# Patient Record
Sex: Female | Born: 1980
Health system: Southern US, Community
[De-identification: ages and names within clinical notes are randomized; demographics above are authoritative.]

## PROBLEM LIST (undated history)

## (undated) DIAGNOSIS — L309 Dermatitis, unspecified: Secondary | ICD-10-CM

## (undated) DIAGNOSIS — L858 Other specified epidermal thickening: Secondary | ICD-10-CM

## (undated) DIAGNOSIS — K219 Gastro-esophageal reflux disease without esophagitis: Secondary | ICD-10-CM

## (undated) DIAGNOSIS — R109 Unspecified abdominal pain: Secondary | ICD-10-CM

## (undated) DIAGNOSIS — L500 Allergic urticaria: Secondary | ICD-10-CM

## (undated) DIAGNOSIS — O24419 Gestational diabetes mellitus in pregnancy, unspecified control: Secondary | ICD-10-CM

## (undated) DIAGNOSIS — K1379 Other lesions of oral mucosa: Secondary | ICD-10-CM

## (undated) DIAGNOSIS — E559 Vitamin D deficiency, unspecified: Secondary | ICD-10-CM

## (undated) HISTORY — DX: Vitamin D deficiency, unspecified: E55.9

## (undated) HISTORY — DX: Dermatitis, unspecified: L30.9

## (undated) HISTORY — DX: Other specified epidermal thickening: L85.8

## (undated) HISTORY — DX: Gestational diabetes mellitus in pregnancy, unspecified control: O24.419

## (undated) HISTORY — DX: Unspecified abdominal pain: R10.9

## (undated) HISTORY — DX: Gastro-esophageal reflux disease without esophagitis: K21.9

## (undated) HISTORY — DX: Allergic urticaria: L50.0

## (undated) HISTORY — DX: Other lesions of oral mucosa: K13.79

---

## 2005-04-22 ENCOUNTER — Encounter: Admission: RE | Admit: 2005-04-22 | Discharge: 2005-04-22 | Payer: Self-pay | Admitting: *Deleted

## 2005-07-20 ENCOUNTER — Encounter: Admission: RE | Admit: 2005-07-20 | Discharge: 2005-07-20 | Payer: Self-pay | Admitting: *Deleted

## 2006-01-18 ENCOUNTER — Encounter: Admission: RE | Admit: 2006-01-18 | Discharge: 2006-01-18 | Payer: Self-pay | Admitting: *Deleted

## 2006-04-23 ENCOUNTER — Encounter: Admission: RE | Admit: 2006-04-23 | Discharge: 2006-04-23 | Payer: Self-pay | Admitting: *Deleted

## 2006-08-27 ENCOUNTER — Encounter: Admission: RE | Admit: 2006-08-27 | Discharge: 2006-08-27 | Payer: Self-pay | Admitting: *Deleted

## 2006-11-30 ENCOUNTER — Encounter: Admission: RE | Admit: 2006-11-30 | Discharge: 2007-01-25 | Payer: Self-pay | Admitting: *Deleted

## 2007-02-02 ENCOUNTER — Inpatient Hospital Stay (HOSPITAL_COMMUNITY): Admission: RE | Admit: 2007-02-02 | Discharge: 2007-02-05 | Payer: Self-pay | Admitting: *Deleted

## 2007-03-22 ENCOUNTER — Encounter: Admission: RE | Admit: 2007-03-22 | Discharge: 2007-03-22 | Payer: Self-pay | Admitting: *Deleted

## 2007-04-28 HISTORY — PX: TOOTH EXTRACTION: SHX859

## 2009-02-28 ENCOUNTER — Ambulatory Visit: Payer: Self-pay | Admitting: Diagnostic Radiology

## 2009-02-28 ENCOUNTER — Ambulatory Visit (HOSPITAL_BASED_OUTPATIENT_CLINIC_OR_DEPARTMENT_OTHER): Admission: RE | Admit: 2009-02-28 | Discharge: 2009-02-28 | Payer: Self-pay | Admitting: Family Medicine

## 2009-07-10 ENCOUNTER — Encounter: Admission: RE | Admit: 2009-07-10 | Discharge: 2009-07-10 | Payer: Self-pay | Admitting: Obstetrics

## 2010-05-28 LAB — HM PAP SMEAR

## 2010-09-09 NOTE — Op Note (Signed)
Yolanda Mccoy, Yolanda Mccoy        ACCOUNT NO.:  [id number]   MEDICAL RECORD NO.:  [id number]          PATIENT TYPE:  INP   LOCATION:  9126                          [facility]ACILITY:  [facility]   PHYSICIAN:  [place] B. Earlene Plater, M.D.  DATE OF BIRTH:  April 27, 1981   DATE OF PROCEDURE:  02/02/2007  DATE OF DISCHARGE:                               OPERATIVE REPORT   PREOPERATIVE DIAGNOSES:  1. Thirty-nine week intrauterine pregnancy.  2. Breech presentation.  3. Refuses attempted external cephalic version.   POSTOPERATIVE DIAGNOSES:  1. Thirty-nine week intrauterine pregnancy.  2. Breech presentation.  3. Refuses attempted external cephalic version.   PROCEDURE:  Primary low transverse C-section.   SURGEON:  Chester Holstein. Earlene Plater, MD.   ASSISTANT:  Lenoard Aden, MD.   ANESTHESIA:  Spinal.   SPECIMENS:  None.   FINDINGS:  A breech presentation, single-footling female, 9 and 9  Apgar's, 5 pounds 10 ounces.   ESTIMATED BLOOD LOSS:  500.   COMPLICATIONS:  None.   INDICATIONS:  Patient with the above-history for primary C-section.  The  risks of surgery were discussed including infection, bleeding, and  damage to surrounding organs.   PROCEDURE:  The patient was taken to the operating room and spinal  anesthesia obtained.  She was prepped and draped in the standard  fashion, a Foley catheter inserted into the bladder.  A Pfannenstiel  incision made, the fascia divided sharply, the posterior sheath to the  peritoneum were elevated and entered sharply, a bladder blade inserted,  a bladder flap created sharply, the uterine incision made in a low  transverse fashion with a knife.  Clear fluid at amniotomy, the incision  extended laterally bluntly and with bandage scissors.   The cavity was explored, a single-footling breech presentation  encountered.  This foot was delivered through the incision and traced  back up to the hips to find the left leg, which was then delivered by  flexion at the knee  and traction at the ankle, the fetus was then  delivered to the posterior iliac crest and rotated to sacrum anterior  with traction at the iliac crests to deliver to the scapulae.  Each arm  was delivered by flexion of the elbows, the head was delivered by  flexion of the neck, nose and mouth suctioned with the bulb, a single  nuchal cord encountered.  Cord clamped and cut, infant handed off to  awaiting pediatricians.  One gram of Ancef given at cord clamp.   The placenta was removed manually, the uterus left in the abdomen and  cleared of all clots and debris.  The uterine incision was free of  extension.  Closed in a running lock stitch with #0 chromic, a second  imbricating layer placed with the same suture, and hemostasis obtained.   The pelvis was irrigated, the uterine incision and bladder flap appeared  normal and were hemostatic.   The fascia was closed in a running stitch of #0 Vicryl.  The  subcutaneous tissue was irrigated and made hemostatic with the Bovie.  The skin was closed with staples.   The patient tolerated the procedure well with no  complications.  She was  taken to the recovery room awake, alert, and in stable condition.  All  counts were correct per the operating room staff.      Gerri Spore B. Earlene Plater, M.D.  Electronically Signed     WBD/MEDQ  D:  02/02/2007  T:  02/02/2007  Job:  [id number]

## 2010-09-12 NOTE — Discharge Summary (Signed)
Yolanda Mccoy, Yolanda Mccoy        ACCOUNT NO.:  [id number]   MEDICAL RECORD NO.:  [id number]          PATIENT TYPE:  INP   LOCATION:  9126                          [facility]   PHYSICIAN:  [place] B. Earlene Plater, M.D.  DATE OF BIRTH:  01-23-1981   DATE OF ADMISSION:  02/02/2007  DATE OF DISCHARGE:  02/05/2007                               DISCHARGE SUMMARY   ADMISSION DIAGNOSES:  1. A 39-week intrauterine pregnancy.  2. Breech presentation.   DISCHARGE DIAGNOSES:  1. A 39-week intrauterine pregnancy.  2. Breech presentation.   PROCEDURE:  Primary low-transverse C-section.   HISTORY OF PRESENT ILLNESS:  A 30 year old Asian female, gravida 1, para  zero with persistent breech presentation, declined attempt at external  cephalic version presented for operative delivery.   HOSPITAL COURSE:  The patient was admitted, underwent a primary low-  transverse C-section.  Single footling breech female.  Apgar's 9 and 9.  Weight 5 pounds 10 ounces noted at the time of surgery.  Normal  appearing uterus, tubes, ovaries.  Postoperatively, the patient  rapidly regained her ability to ambulate,  void, and tolerate a regular diet.  She was discharged home on the 3rd  postoperative day in satisfactory condition.   DISCHARGE MEDICATIONS:  1. Tylox 1-2 p.o. q.4-6 h. p.r.n. pain.  2. Ferrous sulfate 325 mg p.o. daily.   DISCHARGE INSTRUCTIONS:  Per booklet.   FOLLOWUP:  Six weeks.      Gerri Spore B. Earlene Plater, M.D.  Electronically Signed     WBD/MEDQ  D:  03/03/2007  T:  03/03/2007  Job:  [id number]

## 2011-02-05 LAB — CBC
HCT: 27.8 — ABNORMAL LOW
HCT: 39.3
Hemoglobin: 13.2
MCHC: 33.6
MCHC: 33.8
MCV: 83.4
Platelets: 208
Platelets: 268
RBC: 4.71
RDW: 13.9
RDW: 14.2 — ABNORMAL HIGH
WBC: 8.5

## 2011-02-05 LAB — DIFFERENTIAL
Basophils Absolute: 0
Basophils Relative: 0
Eosinophils Absolute: 0.2
Eosinophils Relative: 3
Lymphocytes Relative: 30
Lymphs Abs: 2.5
Monocytes Absolute: 0.8 — ABNORMAL HIGH
Monocytes Relative: 10
Neutro Abs: 4.9
Neutrophils Relative %: 58

## 2011-02-05 LAB — RPR: RPR Ser Ql: NONREACTIVE

## 2011-04-02 ENCOUNTER — Encounter: Payer: Self-pay | Admitting: Family Medicine

## 2011-04-02 ENCOUNTER — Ambulatory Visit (INDEPENDENT_AMBULATORY_CARE_PROVIDER_SITE_OTHER): Payer: 59 | Admitting: Family Medicine

## 2011-04-02 DIAGNOSIS — L858 Other specified epidermal thickening: Secondary | ICD-10-CM

## 2011-04-02 DIAGNOSIS — K1379 Other lesions of oral mucosa: Secondary | ICD-10-CM

## 2011-04-02 DIAGNOSIS — L309 Dermatitis, unspecified: Secondary | ICD-10-CM

## 2011-04-02 DIAGNOSIS — K219 Gastro-esophageal reflux disease without esophagitis: Secondary | ICD-10-CM

## 2011-04-02 DIAGNOSIS — K121 Other forms of stomatitis: Secondary | ICD-10-CM

## 2011-04-02 DIAGNOSIS — R109 Unspecified abdominal pain: Secondary | ICD-10-CM

## 2011-04-02 DIAGNOSIS — Z23 Encounter for immunization: Secondary | ICD-10-CM

## 2011-04-02 DIAGNOSIS — O24419 Gestational diabetes mellitus in pregnancy, unspecified control: Secondary | ICD-10-CM | POA: Insufficient documentation

## 2011-04-02 DIAGNOSIS — Z0000 Encounter for general adult medical examination without abnormal findings: Secondary | ICD-10-CM | POA: Insufficient documentation

## 2011-04-02 HISTORY — DX: Dermatitis, unspecified: L30.9

## 2011-04-02 HISTORY — DX: Gastro-esophageal reflux disease without esophagitis: K21.9

## 2011-04-02 HISTORY — DX: Other lesions of oral mucosa: K13.79

## 2011-04-02 HISTORY — DX: Other specified epidermal thickening: L85.8

## 2011-04-02 HISTORY — DX: Unspecified abdominal pain: R10.9

## 2011-04-02 NOTE — Assessment & Plan Note (Signed)
Encouraged Cetaphil soap and lotion during flares

## 2011-04-02 NOTE — Assessment & Plan Note (Signed)
Will check fasting labs next week

## 2011-04-02 NOTE — Assessment & Plan Note (Signed)
Check HSV titers and consider possibility of reflux leading to ulcers

## 2011-04-02 NOTE — Assessment & Plan Note (Signed)
She reports pain that has been steady since her C Section 4 years ago. It is worse at times and is especially painful when she tries to lie on the left side at night.

## 2011-04-02 NOTE — Assessment & Plan Note (Signed)
Struggles with dry scaly, itchy patches on her leg mostly during the winter. She has a steroid cream she uses as needed. She is encouraged to maintain good hydration and add MegaRed cap daily

## 2011-04-02 NOTE — Progress Notes (Signed)
Yolanda Mccoy [id number] 08/25/1980 04/02/2011      Progress Note New Patient  Subjective  Chief Complaint  Chief Complaint  Patient presents with  . Establish Care    new patient    HPI  Patient is a 30 year old female from [place] in for new patient appointment. She is accompanied by her husband. Her English is adequate but he does help with interpretation at times. She came to the [place] 7 years ago and reports an episode of reflux and abdominal pain which resulted in treatment of bacteria in her abdomen that required several antibiotics for several weeks her reflux symptoms resolved at that time only to recur with pregnancy 4 years ago. With pregnancy she did so with reflux and gestational diabetes. Since the pregnancy she's not had significant reflux that comes on only occasionally but she has had persistent left-sided abdominal pain. It is present most of the time but significantly worse when she tries to live on the left side. She has kept up with her GYN care until this year. At this time she is about 18 months past her Pap smear but denies any trouble with her cycles vaginal discharge. She does move her bowels most days. She did recently have a bout of constipation for 3 days but that has resolved. No fevers, chills, urinary complaints. She does note some problems with dry, scaly patches on her legs responsive to steroid cream and worse in the winter. He also notes some episodes of small asymptomatic bumps on her upper arms. Not present now. No recent febrile illness, congestion, chest pain, palpitations, shortness of breath  Past Medical History  Diagnosis Date  . Chicken pox as a child  . Gestational diabetes   . Eczema 04/02/2011  . Keratosis pilaris 04/02/2011  . Reflux 04/02/2011  . Recurrent oral ulcers 04/02/2011  . Preventative health care 04/02/2011  . Abdominal pain, left lateral 04/02/2011    Past Surgical History  Procedure Date  . Cesarean section 2008    . Tooth extraction 2009    History reviewed. No pertinent family history.  History   Social History  . Marital Status: Married    Spouse Name: N/A    Number of Children: N/A  . Years of Education: N/A   Occupational History  . Not on file.   Social History Main Topics  . Smoking status: Never Smoker   . Smokeless tobacco: Never Used  . Alcohol Use: No  . Drug Use: No  . Sexually Active: Yes   Other Topics Concern  . Not on file   Social History Narrative  . No narrative on file    No current outpatient prescriptions on file prior to visit.    Allergies  Allergen Reactions  . Codeine Nausea Only    Review of Systems  Review of Systems  Constitutional: Negative for fever, chills and malaise/fatigue.  HENT: Negative for hearing loss, nosebleeds and congestion.        Oral ulcers, recurrent, worse during bouts of constipation  Eyes: Negative for discharge.  Respiratory: Negative for cough, sputum production, shortness of breath and wheezing.   Cardiovascular: Negative for chest pain, palpitations and leg swelling.  Gastrointestinal: Negative for heartburn, nausea, vomiting, abdominal pain, diarrhea, constipation and blood in stool.  Genitourinary: Negative for dysuria, urgency, frequency and hematuria.  Musculoskeletal: Negative for myalgias, back pain and falls.  Skin: Negative for rash.  Neurological: Negative for dizziness, tremors, sensory change, focal weakness, loss of consciousness, weakness and  headaches.  Endo/Heme/Allergies: Negative for polydipsia. Does not bruise/bleed easily.  Psychiatric/Behavioral: Negative for depression and suicidal ideas. The patient is not nervous/anxious and does not have insomnia.     Objective  BP 118/76  Pulse 72  Temp(Src) 97.7 F (36.5 C) (Oral)  Ht 5\' 1"  (1.549 m)  Wt 109 lb 12.8 oz (49.805 kg)  BMI 20.75 kg/m2  SpO2 98%  LMP 03/19/2011  Physical Exam  Physical Exam  Constitutional: She is oriented to  person, place, and time and well-developed, well-nourished, and in no distress. No distress.  HENT:  Head: Normocephalic and atraumatic.  Right Ear: External ear normal.  Left Ear: External ear normal.  Nose: Nose normal.  Mouth/Throat: Oropharynx is clear and moist. No oropharyngeal exudate.  Eyes: Conjunctivae are normal. Pupils are equal, round, and reactive to light. Right eye exhibits no discharge. Left eye exhibits no discharge. No scleral icterus.  Neck: Normal range of motion. Neck supple. No thyromegaly present.  Cardiovascular: Normal rate, regular rhythm, normal heart sounds and intact distal pulses.   No murmur heard. Pulmonary/Chest: Effort normal and breath sounds normal. No respiratory distress. She has no wheezes. She has no rales.  Abdominal: Soft. Bowel sounds are normal. She exhibits no distension and no mass. There is no tenderness.  Musculoskeletal: Normal range of motion. She exhibits no edema and no tenderness.  Lymphadenopathy:    She has no cervical adenopathy.  Neurological: She is alert and oriented to person, place, and time. She has normal reflexes. No cranial nerve deficit. Coordination normal.  Skin: Skin is warm and dry. No rash noted. She is not diaphoretic.  Psychiatric: Mood, memory and affect normal.       Assessment & Plan  Reflux Had heartburn during gestation and now has it less frequently. She moved here from [place] and she describes needing a treatment for a stomach bacteria which required several antibiotics and sounds suspicious for H Pylori. Presently she is having increasing abdominal pain. She will be referred for further evaluation to gastroenterology. She has suffered a recent bout of constipation and a flare in reflux   Keratosis pilaris Encouraged Cetaphil soap and lotion during flares  Eczema Struggles with dry scaly, itchy patches on her leg mostly during the winter. She has a steroid cream she uses as needed. She is encouraged to  maintain good hydration and add MegaRed cap daily  Recurrent oral ulcers Check HSV titers and consider possibility of reflux leading to ulcers  Gestational diabetes Will check fasting labs next week  Abdominal pain, left lateral She reports pain that has been steady since her C Section 4 years ago. It is worse at times and is especially painful when she tries to lie on the left side at night.

## 2011-04-02 NOTE — Patient Instructions (Signed)
Gastroesophageal Reflux Disease, Adult Gastroesophageal reflux disease (GERD) happens when acid from your stomach flows up into the esophagus. When acid comes in contact with the esophagus, the acid causes soreness (inflammation) in the esophagus. Over time, GERD may create small holes (ulcers) in the lining of the esophagus. CAUSES   Increased body weight. This puts pressure on the stomach, making acid rise from the stomach into the esophagus.   Smoking. This increases acid production in the stomach.   Drinking alcohol. This causes decreased pressure in the lower esophageal sphincter (valve or ring of muscle between the esophagus and stomach), allowing acid from the stomach into the esophagus.   Late evening meals and a full stomach. This increases pressure and acid production in the stomach.   A malformed lower esophageal sphincter.  Sometimes, no cause is found. SYMPTOMS   Burning pain in the lower part of the mid-chest behind the breastbone and in the mid-stomach area. This may occur twice a week or more often.   Trouble swallowing.   Sore throat.    For the skin need Cetaphil soap and lotion For dry skin increase fluids to at least 64 oz daily and start a MegaRed cap daily For the abdominal pain start a probiotic such as Align caps daily and a fiber supplement such as Benfiber daily  Dry cough.   Asthma-like symptoms including chest tightness, shortness of breath, or wheezing.  DIAGNOSIS  Your caregiver may be able to diagnose GERD based on your symptoms. In some cases, X-rays and other tests may be done to check for complications or to check the condition of your stomach and esophagus. TREATMENT  Your caregiver may recommend over-the-counter or prescription medicines to help decrease acid production. Ask your caregiver before starting or adding any new medicines.  HOME CARE INSTRUCTIONS   Change the factors that you can control. Ask your caregiver for guidance concerning  weight loss, quitting smoking, and alcohol consumption.   Avoid foods and drinks that make your symptoms worse, such as:   Caffeine or alcoholic drinks.   Chocolate.   Peppermint or mint flavorings.   Garlic and onions.   Spicy foods.   Citrus fruits, such as oranges, lemons, or limes.   Tomato-based foods such as sauce, chili, salsa, and pizza.   Fried and fatty foods.   Avoid lying down for the 3 hours prior to your bedtime or prior to taking a nap.   Eat small, frequent meals instead of large meals.   Wear loose-fitting clothing. Do not wear anything tight around your waist that causes pressure on your stomach.   Raise the head of your bed 6 to 8 inches with wood blocks to help you sleep. Extra pillows will not help.   Only take over-the-counter or prescription medicines for pain, discomfort, or fever as directed by your caregiver.   Do not take aspirin, ibuprofen, or other nonsteroidal anti-inflammatory drugs (NSAIDs).  SEEK IMMEDIATE MEDICAL CARE IF:   You have pain in your arms, neck, jaw, teeth, or back.   Your pain increases or changes in intensity or duration.   You develop nausea, vomiting, or sweating (diaphoresis).   You develop shortness of breath, or you faint.   Your vomit is green, yellow, black, or looks like coffee grounds or blood.   Your stool is red, bloody, or black.  These symptoms could be signs of other problems, such as heart disease, gastric bleeding, or esophageal bleeding. MAKE SURE YOU:   Understand these instructions.  Will watch your condition.   Will get help right away if you are not doing well or get worse.  Document Released: 01/21/2005 Document Revised: 12/24/2010 Document Reviewed: 10/31/2010 Exit[employer] Patient Information 2012 [employer]. 

## 2011-04-02 NOTE — Assessment & Plan Note (Addendum)
Had heartburn during gestation and now has it less frequently. She moved here from [place] and she describes needing a treatment for a stomach bacteria which required several antibiotics and sounds suspicious for H Pylori. Presently she is having increasing abdominal pain. She will be referred for further evaluation to gastroenterology. She has suffered a recent bout of constipation and a flare in reflux

## 2011-04-06 ENCOUNTER — Other Ambulatory Visit: Payer: Self-pay | Admitting: Family Medicine

## 2011-04-06 ENCOUNTER — Other Ambulatory Visit (INDEPENDENT_AMBULATORY_CARE_PROVIDER_SITE_OTHER): Payer: 59

## 2011-04-06 DIAGNOSIS — Z0000 Encounter for general adult medical examination without abnormal findings: Secondary | ICD-10-CM

## 2011-04-06 DIAGNOSIS — K121 Other forms of stomatitis: Secondary | ICD-10-CM

## 2011-04-06 DIAGNOSIS — K1370 Unspecified lesions of oral mucosa: Secondary | ICD-10-CM

## 2011-04-06 DIAGNOSIS — R109 Unspecified abdominal pain: Secondary | ICD-10-CM

## 2011-04-06 LAB — CBC
HCT: 37.5 % (ref 36.0–46.0)
MCHC: 33.5 g/dL (ref 30.0–36.0)
RDW: 13.4 % (ref 11.5–14.6)
WBC: 6 10*3/uL (ref 4.5–10.5)

## 2011-04-06 LAB — RENAL FUNCTION PANEL
BUN: 14 mg/dL (ref 6–23)
Chloride: 103 mEq/L (ref 96–112)
GFR: 89.19 mL/min (ref >60.00)
Glucose, Bld: 88 mg/dL (ref 70–99)
Phosphorus: 3.9 mg/dL (ref 2.3–4.6)

## 2011-04-06 LAB — LIPID PANEL
Cholesterol: 183 mg/dL (ref 0–200)
HDL: 70.9 mg/dL (ref >39.00)
Total CHOL/HDL Ratio: 3
Triglycerides: 72 mg/dL (ref 0.0–149.0)

## 2011-04-06 LAB — HEPATIC FUNCTION PANEL
Albumin: 4.3 g/dL (ref 3.5–5.2)
Alkaline Phosphatase: 39 U/L (ref 39–117)
Total Protein: 7.6 g/dL (ref 6.0–8.3)

## 2011-04-06 LAB — TSH: TSH: 2.44 u[IU]/mL (ref 0.35–5.50)

## 2011-04-07 LAB — SEDIMENTATION RATE: Sed Rate: 31 mm/hr — ABNORMAL HIGH (ref 0–22)

## 2011-04-17 ENCOUNTER — Ambulatory Visit (INDEPENDENT_AMBULATORY_CARE_PROVIDER_SITE_OTHER): Payer: 59 | Admitting: Family Medicine

## 2011-04-17 ENCOUNTER — Encounter: Payer: Self-pay | Admitting: Family Medicine

## 2011-04-17 VITALS — BP 113/75 | HR 57 | Ht 61.0 in | Wt 111.0 lb

## 2011-04-17 DIAGNOSIS — B002 Herpesviral gingivostomatitis and pharyngotonsillitis: Secondary | ICD-10-CM

## 2011-04-17 MED ORDER — VALACYCLOVIR HCL 1 G PO TABS: ORAL_TABLET | ORAL | Status: DC

## 2011-04-17 NOTE — Progress Notes (Signed)
OFFICE NOTE  04/17/2011  CC:  Chief Complaint  Patient presents with  . discuss recent labs     HPI: Patient is a 30 y.o. [place] female who is here for discussion of recent +HSV 1 and HVS 2 antibody levels.  She is fairly fluent in [place], husband is accompanying her today and occasionally helps translate.   She has hx of recurrent oral ulcers. Says they occur about 1 time a month and last about 7d, usually located on right buccal mucosa area, painful.  Occasionally has one on tongue. This problem has been occurring for at least several years.  HSV 1 and 2 antibody testing here recently were both positive. She denies any history of genital ulcers.  Husband is here with her today and denies hx of either oral or genital ulcers.  They have a toddler daughter who hasn't had any ulcers.  Pt reports that full prenatal lab panel when pregnant 4 yrs ago was normal, no mention of any abnormal HSV testing. She has otherwise felt well, no recurrent infectious illnesses, no anorexia or wt loss.  Pertinent PMH:  GERD Gest DM Recurrent oral ulcers  Pertinent Meds: None  PE: Blood pressure 113/75, pulse 57, height 5\' 1"  (1.549 m), weight 111 lb (50.349 kg), last menstrual period 04/14/2011. Gen: Alert, well appearing.  Patient is oriented to person, place, time, and situation. ENT: lips/face without lesion.  Oral cavity shows one oval, grey-based, superficial ulceration in right buccal mucosa about 2mm in size.  No other lesions. No pharyngeal erythema or swelling or exudate.   Neck: no LAD      Chemistry      Component Value Date/Time   NA 139 04/06/2011 0938   K 4.0 04/06/2011 0938   CL 103 04/06/2011 0938   CO2 25 04/06/2011 0938   BUN 14 04/06/2011 0938   CREATININE 0.8 04/06/2011 0938      Component Value Date/Time   CALCIUM 9.5 04/06/2011 0938   ALKPHOS 39 04/06/2011 0938   AST 17 04/06/2011 0938   ALT 12 04/06/2011 0938   BILITOT 1.1 04/06/2011 0938     Lab Results    Component Value Date   WBC 6.0 04/06/2011   HGB 12.6 04/06/2011   HCT 37.5 04/06/2011   MCV 80.4 04/06/2011   PLT 335.0 04/06/2011    IMPRESSION AND PLAN: Herpes stomatitis, recurrent. HSV 1 and HSV 2 IgG +.  Discussed general nature of HSV virus, shedding, chronicity at times, usual locations of various ulcer types, etc. I recommended valtrex suppression therapy, 1 g qd and she was agreeable to this. I answered pt's and husbands questions today.  Reassured pt that usually recurrent HSV is not an indication of any SERIOUS underlying immunodeficiency, but we could recheck her HIV status at any time.  I told her I didn't thinks she necessarily needed retesting of HIV today and she and her husband agreed.  FOLLOW UP: 88mo

## 2011-04-30 ENCOUNTER — Encounter: Payer: Self-pay | Admitting: Gastroenterology

## 2011-05-05 ENCOUNTER — Encounter: Payer: Self-pay | Admitting: Gastroenterology

## 2011-05-05 ENCOUNTER — Ambulatory Visit (INDEPENDENT_AMBULATORY_CARE_PROVIDER_SITE_OTHER): Payer: 59 | Admitting: Gastroenterology

## 2011-05-05 DIAGNOSIS — R109 Unspecified abdominal pain: Secondary | ICD-10-CM

## 2011-05-05 NOTE — Patient Instructions (Addendum)
You will be set up for a CT scan of abdomen and pelvis with IV and oral contrast.   You have been scheduled for a CT scan of the abdomen and pelvis at [facility] ([address]---this is in the same building as Architectural technologist).   You are scheduled on 05/06/11 at 230 pm. You should arrive 15 minutes prior to your appointment time for registration. Please follow the written instructions below on the day of your exam:  WARNING: IF YOU ARE ALLERGIC TO IODINE/X-RAY DYE, PLEASE NOTIFY RADIOLOGY IMMEDIATELY AT [phone]! YOU WILL BE GIVEN A 13 HOUR PREMEDICATION PREP.  1) Do not eat or drink anything after 1030 am (4 hours prior to your test) 2) You have been given 2 bottles of oral contrast to drink. The solution may taste better if refrigerated, but do NOT add ice or any other liquid to this solution. Shake  well before drinking.    Drink 1 bottle of contrast @ 1230 pm (2 hours prior to your exam)  Drink 1 bottle of contrast @ 130 pm  (1 hour prior to your exam)   The purpose of you drinking the oral contrast is to aid in the visualization of your intestinal tract. The contrast solution may cause some diarrhea. Before your exam is started, you will be given a small amount of fluid to drink. Depending on your individual set of symptoms, you may also receive an intravenous injection of x-ray contrast/dye. Plan on being at [facility] for 30 minutes or longer, depending on the type of exam you are having performed.  If you have any questions regarding your exam or if you need to reschedule, you may call the CT department at [phone] between the hours of 8:00 am and 5:00 pm, Monday-Friday.  ________________________________________________________________________

## 2011-05-05 NOTE — Progress Notes (Signed)
HPI: This is a   very pleasant 31 year old [place] woman who speaks fairly well in [place], she is here with her husband today. This is the first time I am meeting her.  Has had lefts sided abd pains for about 3-4 years.  Started after the c section.    Pain is ONLY present when laying on the left side.  No post prandial component.  The pain feels like something is pushing from the side.She turns to lay on her other side and hte pain is gone.    She was told her "stomach was out of position".  Abdominal ultrasound November 2010 was normal.  Complete metabolic profile, CBC December 2012 were normal.  Review of systems: Pertinent positive and negative review of systems were noted in the above HPI section. Complete review of systems was performed and was otherwise normal.    Past Medical History  Diagnosis Date  . Chicken pox as a child  . Gestational diabetes   . Eczema 04/02/2011  . Keratosis pilaris 04/02/2011  . Reflux 04/02/2011  . Recurrent oral ulcers 04/02/2011  . Preventative health care 04/02/2011  . Abdominal pain, left lateral 04/02/2011    Past Surgical History  Procedure Date  . Cesarean section 2008  . Tooth extraction 2009    Current Outpatient Prescriptions  Medication Sig Dispense Refill  . valACYclovir (VALTREX) 1000 MG tablet 1 tab po qd  30 tablet  6    Allergies as of 05/05/2011 - Review Complete 05/05/2011  Allergen Reaction Noted  . Codeine Nausea Only 04/02/2011    History reviewed. No pertinent family history.  History   Social History  . Marital Status: Married    Spouse Name: N/A    Number of Children: 1  . Years of Education: N/A   Occupational History  . housewife    Social History Main Topics  . Smoking status: Never Smoker   . Smokeless tobacco: Never Used  . Alcohol Use: No  . Drug Use: No  . Sexually Active: Yes   Other Topics Concern  . Not on file   Social History Narrative  . No narrative on file       Physical  Exam: BP 100/68  Pulse 70  Ht 5\' 1"  (1.549 m)  Wt 112 lb 4.8 oz (50.939 kg)  BMI 21.22 kg/m2  SpO2 99%  LMP 04/14/2011 Constitutional: generally well-appearing Psychiatric: alert and oriented x3 Eyes: extraocular movements intact Mouth: oral pharynx moist, no lesions Neck: supple no lymphadenopathy Cardiovascular: heart regular rate and rhythm Lungs: clear to auscultation bilaterally Abdomen: soft, nontender, nondistended, no obvious ascites, no peritoneal signs, normal bowel sounds Extremities: no lower extremity edema bilaterally Skin: no lesions on visible extremities    Assessment and plan: 31 y.o. female with  mild left sided abdominal pain that is very positional  Eating does not alter the pain, moving her bowels does not alter the pain. Only laying on her left side causes the pain. This seems more musculoskeletal than gastrointestinal. She had abdominal ultrasound about 2-1/2 years ago and that was normal. I think repeat imaging with a CT scan abdomen and pelvis is the next best step if that is unrevealing, given no other GI symptoms I would probably refer her back to her primary care physician.

## 2011-05-06 ENCOUNTER — Ambulatory Visit (INDEPENDENT_AMBULATORY_CARE_PROVIDER_SITE_OTHER)
Admission: RE | Admit: 2011-05-06 | Discharge: 2011-05-06 | Disposition: A | Discharge status: Home / Self Care | Payer: 59 | Source: Ambulatory Visit | Attending: Gastroenterology | Admitting: Gastroenterology

## 2011-05-06 DIAGNOSIS — R109 Unspecified abdominal pain: Secondary | ICD-10-CM

## 2011-05-06 MED ORDER — IOHEXOL 300 MG/ML  SOLN
80.0000 mL | Freq: Once | INTRAMUSCULAR | Status: AC | PRN
  Administered 2011-05-06: 80 mL via INTRAVENOUS

## 2011-06-11 ENCOUNTER — Encounter: Payer: Self-pay | Admitting: Family Medicine

## 2011-06-11 ENCOUNTER — Ambulatory Visit (INDEPENDENT_AMBULATORY_CARE_PROVIDER_SITE_OTHER): Payer: 59 | Admitting: Family Medicine

## 2011-06-11 DIAGNOSIS — R319 Hematuria, unspecified: Secondary | ICD-10-CM

## 2011-06-11 DIAGNOSIS — R109 Unspecified abdominal pain: Secondary | ICD-10-CM

## 2011-06-11 LAB — POCT URINALYSIS DIPSTICK
Bilirubin, UA: NEGATIVE
Glucose, UA: NEGATIVE
Spec Grav, UA: 1.02
pH, UA: 6.5

## 2011-06-11 LAB — LIPASE: Lipase: 33 U/L (ref 11.0–59.0)

## 2011-06-11 NOTE — Progress Notes (Signed)
Addended by: Baldemar Lenis R on: 06/11/2011 11:34 AM   Modules accepted: Orders

## 2011-06-11 NOTE — Assessment & Plan Note (Signed)
No c/o at this time.

## 2011-06-11 NOTE — Patient Instructions (Signed)
Ovarian Cyst The ovaries are small organs that are on each side of the uterus. The ovaries are the organs that produce the female hormones, estrogen and progesterone. An ovarian cyst is a sac filled with fluid that can vary in its size. It is normal for a small cyst to form in women who are in the childbearing age and who have menstrual periods. This type of cyst is called a follicle cyst that becomes an ovulation cyst (corpus luteum cyst) after it produces the women's egg. It later goes away on its own if the woman does not become pregnant. There are other kinds of ovarian cysts that may cause problems and may need to be treated. The most serious problem is a cyst with cancer. It should be noted that menopausal women who have an ovarian cyst are at a higher risk of it being a cancer cyst. They should be evaluated very quickly, thoroughly and followed closely. This is especially true in menopausal women because of the high rate of ovarian cancer in women in menopause. CAUSES AND TYPES OF OVARIAN CYSTS:  FUNCTIONAL CYST: The follicle/corpus luteum cyst is a functional cyst that occurs every month during ovulation with the menstrual cycle. They go away with the next menstrual cycle if the woman does not get pregnant. Usually, there are no symptoms with a functional cyst.   ENDOMETRIOMA CYST: This cyst develops from the lining of the uterus tissue. This cyst gets in or on the ovary. It grows every month from the bleeding during the menstrual period. It is also called a "chocolate cyst" because it becomes filled with blood that turns brown. This cyst can cause pain in the lower abdomen during intercourse and with your menstrual period.   CYSTADENOMA CYST: This cyst develops from the cells on the outside of the ovary. They usually are not cancerous. They can get very big and cause lower abdomen pain and pain with intercourse. This type of cyst can twist on itself, cut off its blood supply and cause severe pain.  It also can easily rupture and cause a lot of pain.   DERMOID CYST: This type of cyst is sometimes found in both ovaries. They are found to have different kinds of body tissue in the cyst. The tissue includes skin, teeth, hair, and/or cartilage. They usually do not have symptoms unless they get very big. Dermoid cysts are rarely cancerous.   POLYCYSTIC OVARY: This is a rare condition with hormone problems that produces many small cysts on both ovaries. The cysts are follicle-like cysts that never produce an egg and become a corpus luteum. It can cause an increase in body weight, infertility, acne, increase in body and facial hair and lack of menstrual periods or rare menstrual periods. Many women with this problem develop type 2 diabetes. The exact cause of this problem is unknown. A polycystic ovary is rarely cancerous.   THECA LUTEIN CYST: Occurs when too much hormone (human chorionic gonadotropin) is produced and over-stimulates the ovaries to produce an egg. They are frequently seen when doctors stimulate the ovaries for invitro-fertilization (test tube babies).   LUTEOMA CYST: This cyst is seen during pregnancy. Rarely it can cause an obstruction to the birth canal during labor and delivery. They usually go away after delivery.  SYMPTOMS   Pelvic pain or pressure.   Pain during sexual intercourse.   Increasing girth (swelling) of the abdomen.   Abnormal menstrual periods.   Increasing pain with menstrual periods.   You stop having   menstrual periods and you are not pregnant.  DIAGNOSIS  The diagnosis can be made during:  Routine or annual pelvic examination (common).   Ultrasound.   X-ray of the pelvis.   CT Scan.   MRI.   Blood tests.  TREATMENT   Treatment may only be to follow the cyst monthly for 2 to 3 months with your caregiver. Many go away on their own, especially functional cysts.   May be aspirated (drained) with a long needle with ultrasound, or by laparoscopy  (inserting a tube into the pelvis through a small incision).   The whole cyst can be removed by laparoscopy.   Sometimes the cyst may need to be removed through an incision in the lower abdomen.   Hormone treatment is sometimes used to help dissolve certain cysts.   Birth control pills are sometimes used to help dissolve certain cysts.  HOME CARE INSTRUCTIONS  Follow your caregiver's advice regarding:  Medicine.   Follow up visits to evaluate and treat the cyst.   You may need to come back or make an appointment with another caregiver, to find the exact cause of your cyst, if your caregiver is not a gynecologist.   Get your yearly and recommended pelvic examinations and Pap tests.   Let your caregiver know if you have had an ovarian cyst in the past.  SEEK MEDICAL CARE IF:   Your periods are late, irregular, they stop, or are painful.   Your stomach (abdomen) or pelvic pain does not go away.   Your stomach becomes larger or swollen.   You have pressure on your bladder or trouble emptying your bladder completely.   You have painful sexual intercourse.   You have feelings of fullness, pressure, or discomfort in your stomach.   You lose weight for no apparent reason.   You feel generally ill.   You become constipated.   You lose your appetite.   You develop acne.   You have an increase in body and facial hair.   You are gaining weight, without changing your exercise and eating habits.   You think you are pregnant.  SEEK IMMEDIATE MEDICAL CARE IF:   You have increasing abdominal pain.   You feel sick to your stomach (nausea) and/or vomit.   You develop a fever that comes on suddenly.   You develop abdominal pain during a bowel movement.   Your menstrual periods become heavier than usual.  Document Released: 04/13/2005 Document Revised: 12/24/2010 Document Reviewed: 02/14/2009 [employer] Patient Information 2012 [employer]. 

## 2011-06-11 NOTE — Progress Notes (Signed)
Patient ID: Yolanda Mccoy, female   DOB: [birth year], 31 y.o.   MRN: [id number] Yolanda Mccoy [id number] 1980-04-30 06/11/2011      Progress Note-Follow Up  Subjective  Chief Complaint  Chief Complaint  Patient presents with  . Follow-up    left sided pain, no improvement    HPI  Patient is a 31 year old Asian female who is in today for followup on abdominal pain. The pain is not acute or new but continues to recur. It started roughly 3 years ago shortly after the birth of her only child. He is always left-sided. It is worse when she lies on it. It is worse with fatty and large meals at times. Sometimes higher on the left side sometimes lower. Recent CAT scan was unremarkable. He denies anorexia, nausea vomiting, fevers, chills, GU symptoms. She's had no chest pain no palpitations, sore throat, dysphagia, congestion or other concerns. Her bowels are never bloody or black he comfortably.  Past Medical History  Diagnosis Date  . Chicken pox as a child  . Gestational diabetes   . Eczema 04/02/2011  . Keratosis pilaris 04/02/2011  . Reflux 04/02/2011  . Recurrent oral ulcers 04/02/2011  . Preventative health care 04/02/2011  . Abdominal pain, left lateral 04/02/2011    Past Surgical History  Procedure Date  . Cesarean section 2008  . Tooth extraction 2009    No family history on file.  History   Social History  . Marital Status: Married    Spouse Name: N/A    Number of Children: 1  . Years of Education: N/A   Occupational History  . housewife    Social History Main Topics  . Smoking status: Never Smoker   . Smokeless tobacco: Never Used  . Alcohol Use: No  . Drug Use: No  . Sexually Active: Yes   Other Topics Concern  . Not on file   Social History Narrative  . No narrative on file    Current Outpatient Prescriptions on File Prior to Visit  Medication Sig Dispense Refill  . valACYclovir (VALTREX) 1000 MG tablet 1 tab po qd  30 tablet  6     Allergies  Allergen Reactions  . Codeine Nausea Only    Review of Systems  Review of Systems  Constitutional: Negative for fever and malaise/fatigue.  HENT: Negative for congestion.   Eyes: Negative for discharge.  Respiratory: Negative for shortness of breath.   Cardiovascular: Negative for chest pain, palpitations and leg swelling.  Gastrointestinal: Positive for abdominal pain. Negative for nausea, diarrhea, constipation, blood in stool and melena.  Genitourinary: Negative for dysuria, urgency, frequency and hematuria.  Musculoskeletal: Negative for falls.  Skin: Negative for rash.  Neurological: Negative for loss of consciousness and headaches.  Endo/Heme/Allergies: Negative for polydipsia.  Psychiatric/Behavioral: Negative for depression and suicidal ideas. The patient is not nervous/anxious and does not have insomnia.     Objective  BP 114/77  Pulse 63  Temp(Src) 98.2 F (36.8 C) (Temporal)  Ht 5\' 1"  (1.549 m)  Wt 112 lb (50.803 kg)  BMI 21.16 kg/m2  SpO2 100%  Physical Exam  Physical Exam  Constitutional: She is oriented to person, place, and time and well-developed, well-nourished, and in no distress. No distress.  HENT:  Head: Normocephalic and atraumatic.  Eyes: Conjunctivae are normal.  Neck: Neck supple. No thyromegaly present.  Cardiovascular: Normal rate, regular rhythm and normal heart sounds.   No murmur heard. Pulmonary/Chest: Effort normal and breath sounds normal. She has no wheezes.  Abdominal: She exhibits no distension and no mass.  Musculoskeletal: She exhibits no edema.  Lymphadenopathy:    She has no cervical adenopathy.  Neurological: She is alert and oriented to person, place, and time.  Skin: Skin is warm and dry. No rash noted. She is not diaphoretic.  Psychiatric: Memory, affect and judgment normal.    Lab Results  Component Value Date   TSH 2.44 04/06/2011   Lab Results  Component Value Date   WBC 6.0 04/06/2011   HGB  12.6 04/06/2011   HCT 37.5 04/06/2011   MCV 80.4 04/06/2011   PLT 335.0 04/06/2011   Lab Results  Component Value Date   CREATININE 0.8 04/06/2011   BUN 14 04/06/2011   NA 139 04/06/2011   K 4.0 04/06/2011   CL 103 04/06/2011   CO2 25 04/06/2011   Lab Results  Component Value Date   ALT 12 04/06/2011   AST 17 04/06/2011   ALKPHOS 39 04/06/2011   BILITOT 1.1 04/06/2011   Lab Results  Component Value Date   CHOL 183 04/06/2011   Lab Results  Component Value Date   HDL 70.90 04/06/2011   Lab Results  Component Value Date   LDLCALC 98 04/06/2011   Lab Results  Component Value Date   TRIG 72.0 04/06/2011   Lab Results  Component Value Date   CHOLHDL 3 04/06/2011     Assessment & Plan  Abdominal pain, left lateral Pain is intermittent and has been present off and on for roughly 3 years, started shortly after the birth of her 33-year-old son. It is sometimes higher in the left side is much lower. It is worse when she lies on it. It is also worse when she eats larger amounts of fatty foods. She names state and ice cream today. His pain level is at a 7/10. At times there is no pain at other times it is an aching dull discomfort for several days. The sharp pains tend to only last minutes to no more than an hour. No nausea, vomiting, change in urinary patterns. She's not been able to correlated to menstrual cycles on a regular basis but occasionally. Unclear etiology recent normal CAT scan. She does have an OB/GYN. They are encouraged to recontact office and see if they can get a pelvic ultrasound near the time when the pain is severe to evaluate for possible ovarian follicles complicating the situation. Suspect possible abdominal adhesions with the bowels catching intermittently. Also have some concerns for pancreatic or gastric dysfunction. We'll check an H. pylori and lipase as well as urinalysis for completeness sake and she will present if her pain worsens. She is encouraged to  maintain a healthy diet with high fiber plenty of fluids and probiotics.  Reflux No c/o at this time

## 2011-06-11 NOTE — Assessment & Plan Note (Signed)
Pain is intermittent and has been present off and on for roughly 3 years, started shortly after the birth of her 33-year-old son. It is sometimes higher in the left side is much lower. It is worse when she lies on it. It is also worse when she eats larger amounts of fatty foods. She names state and ice cream today. His pain level is at a 7/10. At times there is no pain at other times it is an aching dull discomfort for several days. The sharp pains tend to only last minutes to no more than an hour. No nausea, vomiting, change in urinary patterns. She's not been able to correlated to menstrual cycles on a regular basis but occasionally. Unclear etiology recent normal CAT scan. She does have an OB/GYN. They are encouraged to recontact office and see if they can get a pelvic ultrasound near the time when the pain is severe to evaluate for possible ovarian follicles complicating the situation. Suspect possible abdominal adhesions with the bowels catching intermittently. Also have some concerns for pancreatic or gastric dysfunction. We'll check an H. pylori and lipase as well as urinalysis for completeness sake and she will present if her pain worsens. She is encouraged to maintain a healthy diet with high fiber plenty of fluids and probiotics.

## 2011-06-13 LAB — URINE CULTURE

## 2012-02-09 ENCOUNTER — Ambulatory Visit (INDEPENDENT_AMBULATORY_CARE_PROVIDER_SITE_OTHER): Payer: 59

## 2012-02-09 DIAGNOSIS — Z23 Encounter for immunization: Secondary | ICD-10-CM

## 2012-04-28 ENCOUNTER — Emergency Department
Admission: EM | Admit: 2012-04-28 | Discharge: 2012-04-28 | Disposition: A | Discharge status: Home / Self Care | Payer: 59 | Source: Home / Self Care | Attending: Family Medicine | Admitting: Family Medicine

## 2012-04-28 ENCOUNTER — Encounter: Payer: Self-pay | Admitting: *Deleted

## 2012-04-28 DIAGNOSIS — R05 Cough: Secondary | ICD-10-CM

## 2012-04-28 DIAGNOSIS — R059 Cough, unspecified: Secondary | ICD-10-CM

## 2012-04-28 DIAGNOSIS — R6889 Other general symptoms and signs: Secondary | ICD-10-CM

## 2012-04-28 DIAGNOSIS — R51 Headache: Secondary | ICD-10-CM

## 2012-04-28 DIAGNOSIS — R5381 Other malaise: Secondary | ICD-10-CM

## 2012-04-28 LAB — POCT INFLUENZA A/B
Influenza A, POC: NEGATIVE
Influenza B, POC: NEGATIVE

## 2012-04-28 NOTE — ED Provider Notes (Signed)
History     CSN: [id number]  Arrival date & time 04/28/12  1324   First MD Initiated Contact with Patient 04/28/12 1325      Chief Complaint  Patient presents with  . Generalized Body Aches  . Headache    HPI URI Symptoms Onset: 4-5 days  Description: generalized malaise, body aches, headache, rhinorrhea, nasal congestion, sinus pressure  Modifying factors:  Prior hx/o migraine. Has had some mild migrainous pain since onset of sxs.   Symptoms Nasal discharge: yes Fever: no Sore throat: mild Cough: yes Wheezing: no Ear pain: no GI symptoms: no Sick contacts: yes  Red Flags  Stiff neck: no Dyspnea: no Rash: no Swallowing difficulty: no  Sinusitis Risk Factors Headache/face pain: yes Double sickening: no tooth pain: no  Allergy Risk Factors Sneezing: yes Itchy scratchy throat: no Seasonal symptoms: yes  Flu Risk Factors Headache: yes muscle aches: yes severe fatigue: mild     Past Medical History  Diagnosis Date  . Chicken pox as a child  . Gestational diabetes   . Eczema 04/02/2011  . Keratosis pilaris 04/02/2011  . Reflux 04/02/2011  . Recurrent oral ulcers 04/02/2011  . Preventative health care 04/02/2011  . Abdominal pain, left lateral 04/02/2011    Past Surgical History  Procedure Date  . Cesarean section 2008  . Tooth extraction 2009    No family history on file.  History  Substance Use Topics  . Smoking status: Never Smoker   . Smokeless tobacco: Never Used  . Alcohol Use: No    OB History    Grav Para Term Preterm Abortions TAB SAB Ect Mult Living                  Review of Systems  All other systems reviewed and are negative.    Allergies  Codeine  Home Medications   Current Outpatient Rx  Name  Route  Sig  Dispense  Refill  . VALACYCLOVIR HCL 1 G PO TABS      1 tab po qd   30 tablet   6     BP 108/72  Pulse 91  Temp 98 F (36.7 C) (Oral)  Resp 14  Ht 5\' 1"  (1.549 m)  Wt 110 lb (49.896 kg)  BMI 20.78 kg/m2   SpO2 100%  Physical Exam  Constitutional: She appears well-developed and well-nourished.  HENT:  Head: Normocephalic and atraumatic.  Right Ear: External ear normal.  Left Ear: External ear normal.       +nasal erythema, rhinorrhea bilaterally, + post oropharyngeal erythema    Eyes: Conjunctivae normal are normal. Pupils are equal, round, and reactive to light.  Neck: Normal range of motion. Neck supple.  Cardiovascular: Normal rate, regular rhythm and normal heart sounds.   Pulmonary/Chest: Effort normal and breath sounds normal.  Abdominal: Soft. Bowel sounds are normal.  Musculoskeletal: Normal range of motion.  Lymphadenopathy:    She has no cervical adenopathy.  Neurological: She is alert.  Skin: Skin is warm.    ED Course  Procedures (including critical care time)   Labs Reviewed  POCT INFLUENZA A/B   No results found.   1. Flu-like symptoms       MDM  Rapid flu negative.  Suspect flu vs. Flu like illness. Discussed supportive care as well as infectious and general red flags.  Handout given  Follow up as needed.     The patient and/or caregiver has been counseled thoroughly with regard to treatment plan and/or medications  prescribed including dosage, schedule, interactions, rationale for use, and possible side effects and they verbalize understanding. Diagnoses and expected course of recovery discussed and will return if not improved as expected or if the condition worsens. Patient and/or caregiver verbalized understanding.             Doree Albee, MD 04/28/12 [phone]

## 2012-04-28 NOTE — ED Notes (Signed)
Patient c/o 5 days of HA, body aches, cough, chills/sweats. Influenza vaccine given in the spring of 2013.

## 2012-04-29 ENCOUNTER — Ambulatory Visit: Payer: 59 | Admitting: Family Medicine

## 2012-10-20 ENCOUNTER — Encounter: Payer: Self-pay | Admitting: Nurse Practitioner

## 2012-10-20 ENCOUNTER — Ambulatory Visit (INDEPENDENT_AMBULATORY_CARE_PROVIDER_SITE_OTHER): Payer: 59 | Admitting: Nurse Practitioner

## 2012-10-20 ENCOUNTER — Ambulatory Visit (INDEPENDENT_AMBULATORY_CARE_PROVIDER_SITE_OTHER)
Admission: RE | Admit: 2012-10-20 | Discharge: 2012-10-20 | Disposition: A | Discharge status: Home / Self Care | Payer: 59 | Source: Ambulatory Visit | Attending: Nurse Practitioner | Admitting: Nurse Practitioner

## 2012-10-20 VITALS — BP 100/60 | HR 69 | Temp 97.9°F | Ht 61.0 in | Wt 114.0 lb

## 2012-10-20 DIAGNOSIS — S8010XA Contusion of unspecified lower leg, initial encounter: Secondary | ICD-10-CM

## 2012-10-20 DIAGNOSIS — S99929A Unspecified injury of unspecified foot, initial encounter: Secondary | ICD-10-CM

## 2012-10-20 DIAGNOSIS — S8990XA Unspecified injury of unspecified lower leg, initial encounter: Secondary | ICD-10-CM

## 2012-10-20 DIAGNOSIS — S8011XA Contusion of right lower leg, initial encounter: Secondary | ICD-10-CM

## 2012-10-20 DIAGNOSIS — S8991XA Unspecified injury of right lower leg, initial encounter: Secondary | ICD-10-CM

## 2012-10-20 NOTE — Progress Notes (Signed)
Subjective:    Yolanda Mccoy is a 32 y.o. female who presents with right lower leg pain. Onset of the symptoms was 2 hrs ago, sudden, related to being struck by object. 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 Pain is currently located anterior mid- shinn. Pain is described as aching, with intensity at worst 6 on a 0-10 pain scale. The pain is constant and worse with weight bearing. Impact of symptoms on Yolanda Mccoy has been that she has been unable to bear weight without pain. Symptoms have stabilized. Patient has had no prior leg problems. Evaluation to date: none. Treatment to date: rest and using homeopathic cream. Past musculoskeletal history: negative for previous injuries or other musculoskeletal conditions.  The following portions of the patient's history were reviewed and updated as appropriate: allergies, current medications, past family history, past medical history, past social history, past surgical history and problem list.  Review of Systems Pertinent items are noted in HPI.     Objective:    BP 100/60  Pulse 69  Temp(Src) 97.9 F (36.6 C) (Oral)  Ht 5\' 1"  (1.549 m)  Wt 114 lb (51.71 kg)  BMI 21.55 kg/m2  SpO2 97%  LMP 10/06/2012 Right leg:  positive exam findings: warmth, tenderness noted anterior, shinn over tibia, ecchymosis noted at site of injury-approx 2 cm area faint discoloration-bruising and superficial abrasion approx 3 mm, no active bleeding  Left leg:  normal   Imaging: X-ray right tib/fib: no fracture, dislocation, swelling or degenerative changes noted and will send for over-read    Assessment:  Contusion R LE    Plan:    Natural history and expected course discussed. Questions answered. Rest, ice, compression, and elevation (RICE)  therapy. See pt instructions

## 2012-10-20 NOTE — Patient Instructions (Addendum)
Ice leg for 10 mins. 4-5 times daily for 1st 24 hours, then as needed for comfort. Our office will call you with official xray results. Use tylenol for pain. If no fracture, you may use ibuprophen. Weight bearing as tolerated.  Contusion A contusion is a deep bruise. Contusions are the result of an injury that caused bleeding under the skin. The contusion may turn blue, purple, or yellow. Minor injuries will give you a painless contusion, but more severe contusions may stay painful and swollen for a few weeks.  CAUSES  A contusion is usually caused by a blow, trauma, or direct force to an area of the body. SYMPTOMS   Swelling and redness of the injured area.  Bruising of the injured area.  Tenderness and soreness of the injured area.  Pain. DIAGNOSIS  The diagnosis can be made by taking a history and physical exam. An X-ray, CT scan, or MRI may be needed to determine if there were any associated injuries, such as fractures. TREATMENT  Specific treatment will depend on what area of the body was injured. In general, the best treatment for a contusion is resting, icing, elevating, and applying cold compresses to the injured area. Over-the-counter medicines may also be recommended for pain control. Ask your caregiver what the best treatment is for your contusion. HOME CARE INSTRUCTIONS   Put ice on the injured area.  Put ice in a plastic bag.  Place a towel between your skin and the bag.  Leave the ice on for 15-20 minutes, 3-4 times a day.  Only take over-the-counter or prescription medicines for pain, discomfort, or fever as directed by your caregiver. Your caregiver may recommend avoiding anti-inflammatory medicines (aspirin, ibuprofen, and naproxen) for 48 hours because these medicines may increase bruising.  Rest the injured area.  If possible, elevate the injured area to reduce swelling. SEEK IMMEDIATE MEDICAL CARE IF:   You have increased bruising or swelling.  You have pain  that is getting worse.  Your swelling or pain is not relieved with medicines. MAKE SURE YOU:   Understand these instructions.  Will watch your condition.  Will get help right away if you are not doing well or get worse. Document Released: 01/21/2005 Document Revised: 07/06/2011 Document Reviewed: 02/16/2011 [employer] [facility] Patient Information 2014 [place], [place].

## 2013-09-13 ENCOUNTER — Encounter: Payer: Self-pay | Admitting: Family Medicine

## 2013-09-13 ENCOUNTER — Ambulatory Visit (INDEPENDENT_AMBULATORY_CARE_PROVIDER_SITE_OTHER): Payer: 59 | Admitting: Family Medicine

## 2013-09-13 VITALS — BP 145/92 | HR 73 | Temp 97.7°F | Resp 18 | Ht 60.75 in | Wt 111.0 lb

## 2013-09-13 DIAGNOSIS — J019 Acute sinusitis, unspecified: Secondary | ICD-10-CM

## 2013-09-13 DIAGNOSIS — J309 Allergic rhinitis, unspecified: Secondary | ICD-10-CM

## 2013-09-13 DIAGNOSIS — J302 Other seasonal allergic rhinitis: Secondary | ICD-10-CM | POA: Insufficient documentation

## 2013-09-13 DIAGNOSIS — J3089 Other allergic rhinitis: Secondary | ICD-10-CM | POA: Insufficient documentation

## 2013-09-13 MED ORDER — FLUTICASONE PROPIONATE 50 MCG/ACT NA SUSP: 2.0000 | Freq: Every day | NASAL | Status: DC

## 2013-09-13 MED ORDER — AMOXICILLIN 875 MG PO TABS: 875.0000 mg | ORAL_TABLET | Freq: Two times a day (BID) | ORAL | Status: AC

## 2013-09-13 NOTE — Progress Notes (Signed)
OFFICE NOTE  09/13/2013  CC:  Chief Complaint  Patient presents with  . Cough  . Allergies    1 month, has tried allegra     HPI: Patient is a 33 y.o. Asian female who is here for cough.   About 6 wks of lots of nasal congestion, sneezing a lot, some pain around orbits but no teeth pain or face pain.  No fever.  Cough began about 1 wk ago.  Dry cough, no chest tightness or wheezing. Has been trying allegra D and alka selzer.  These sx's started happening every spring about 7 yrs ago.  Pertinent PMH:  Past medical, surgical, social, and family history reviewed and no changes are noted since last office visit.  MEDS:  Outpatient Prescriptions Prior to Visit  Medication Sig Dispense Refill  . valACYclovir (VALTREX) 1000 MG tablet 1 tab po qd  30 tablet  6   No facility-administered medications prior to visit.    PE: Blood pressure 145/92, pulse 73, temperature 97.7 F (36.5 C), temperature source Oral, resp. rate 18, height 5' 0.75" (1.543 m), weight 111 lb (50.349 kg), SpO2 98.00%. VS: noted--normal. Gen: alert, NAD, NONTOXIC APPEARING. HEENT: eyes without injection, drainage, or swelling.  Ears: EACs clear, TMs with normal light reflex and landmarks.  Nose: Clear rhinorrhea, with some dried, crusty exudate adherent to mildly injected and edematous mucosa.  No purulent d/c.  No paranasal sinus TTP.  No facial swelling.  Throat and mouth without focal lesion.  No pharyngial swelling, erythema, or exudate.   Neck: supple, no LAD.   LUNGS: CTA bilat, nonlabored resps.   CV: RRR, no m/r/g. EXT: no c/c/e SKIN: no rash    IMPRESSION AND PLAN:  Allergic rhinitis with acute infectious sinusitis. Amoxil 875mg  bid x 10d. Start flonase qd. Continue allegra D prn.  An After Visit Summary was printed and given to the patient.  FOLLOW UP: prn

## 2013-09-13 NOTE — Progress Notes (Signed)
Pre visit review using our clinic review tool, if applicable. No additional management support is needed unless otherwise documented below in the visit note. 

## 2014-03-09 ENCOUNTER — Encounter: Payer: Self-pay | Admitting: Nurse Practitioner

## 2014-03-09 ENCOUNTER — Ambulatory Visit (INDEPENDENT_AMBULATORY_CARE_PROVIDER_SITE_OTHER): Payer: 59 | Admitting: Nurse Practitioner

## 2014-03-09 VITALS — BP 114/77 | HR 70 | Temp 98.1°F | Ht 60.75 in | Wt 109.0 lb

## 2014-03-09 DIAGNOSIS — J069 Acute upper respiratory infection, unspecified: Secondary | ICD-10-CM

## 2014-03-09 MED ORDER — DOXYCYCLINE HYCLATE 100 MG PO TABS
100.0000 mg | ORAL_TABLET | Freq: Two times a day (BID) | ORAL | Status: DC
Start: 2014-03-09 — End: 2014-04-09

## 2014-03-09 MED ORDER — BENZONATATE 100 MG PO CAPS: ORAL_CAPSULE | ORAL | Status: DC

## 2014-03-09 NOTE — Patient Instructions (Signed)
You have a viral illness that has caused sinusitis & bronchitis. For cough, you may use benzonatate capsules as needed for cough. For nasal congestion, start daily sinus rinses (Neilmed Sinus rinse). You may also use pseudoephedrine 30 -60 mg twice daily for 4-6 days. For sore throat use benzocaine throat lozenges.  Start antibiotic. Eat yogurt daily to help prevent antibiotic -associated diarrhea Please call for re-evaluation if you are not improving or develop chest pain with inspiration or fever.  Acute Bronchitis Bronchitis is inflammation of the airways that extend from the windpipe into the lungs (bronchi). The inflammation often causes mucus to develop. This leads to a cough, which is the most common symptom of bronchitis.  In acute bronchitis, the condition usually develops suddenly and goes away over time, usually in a couple weeks. Smoking, allergies, and asthma can make bronchitis worse. Repeated episodes of bronchitis may cause further lung problems.  CAUSES Acute bronchitis is most often caused by the same virus that causes a cold. The virus can spread from person to person (contagious).  SIGNS AND SYMPTOMS   Cough.   Fever.   Coughing up mucus.   Body aches.   Chest congestion.   Chills.   Shortness of breath.   Sore throat.  DIAGNOSIS  Acute bronchitis is usually diagnosed through a physical exam. Tests, such as chest X-rays, are sometimes done to rule out other conditions.  TREATMENT  Acute bronchitis usually goes away in a couple weeks. Often times, no medical treatment is necessary. Medicines are sometimes given for relief of fever or cough. Antibiotics are usually not needed but may be prescribed in certain situations. In some cases, an inhaler may be recommended to help reduce shortness of breath and control the cough. A cool mist vaporizer may also be used to help thin bronchial secretions and make it easier to clear the chest.  HOME CARE  INSTRUCTIONS  Get plenty of rest.   Drink enough fluids to keep your urine clear or pale yellow (unless you have a medical condition that requires fluid restriction). Increasing fluids may help thin your secretions and will prevent dehydration.   Only take over-the-counter or prescription medicines as directed by your health care provider.   Avoid smoking and secondhand smoke. Exposure to cigarette smoke or irritating chemicals will make bronchitis worse. If you are a smoker, consider using nicotine gum or skin patches to help control withdrawal symptoms. Quitting smoking will help your lungs heal faster.   Reduce the chances of another bout of acute bronchitis by washing your hands frequently, avoiding people with cold symptoms, and trying not to touch your hands to your mouth, nose, or eyes.   Follow up with your health care provider as directed.  SEEK MEDICAL CARE IF: Your symptoms do not improve after 1 week of treatment.  SEEK IMMEDIATE MEDICAL CARE IF:  You develop an increased fever or chills.   You have chest pain.   You have severe shortness of breath.  You have bloody sputum.   You develop dehydration.  You develop fainting.  You develop repeated vomiting.  You develop a severe headache. MAKE SURE YOU:   Understand these instructions.  Will watch your condition.  Will get help right away if you are not doing well or get worse. Document Released: 05/21/2004 Document Revised: 12/14/2012 Document Reviewed: 10/04/2012 [facility] Patient Information 2014 [place], [place].  Sinusitis Sinusitis is redness, soreness, and swelling (inflammation) of the paranasal sinuses. Paranasal sinuses are air pockets within the bones of  your face (beneath the eyes, the middle of the forehead, or above the eyes). In healthy paranasal sinuses, mucus is able to drain out, and air is able to circulate through them by way of your nose. However, when your paranasal sinuses are  inflamed, mucus and air can become trapped. This can allow bacteria and other germs to grow and cause infection. Sinusitis can develop quickly and last only a short time (acute) or continue over a long period (chronic). Sinusitis that lasts for more than 12 weeks is considered chronic.  CAUSES  Causes of sinusitis include:  Allergies.  Structural abnormalities, such as displacement of the cartilage that separates your nostrils (deviated septum), which can decrease the air flow through your nose and sinuses and affect sinus drainage.  Functional abnormalities, such as when the small hairs (cilia) that line your sinuses and help remove mucus do not work properly or are not present. SYMPTOMS  Symptoms of acute and chronic sinusitis are the same. The primary symptoms are pain and pressure around the affected sinuses. Other symptoms include:  Upper toothache.  Earache.  Headache.  Bad breath.  Decreased sense of smell and taste.  A cough, which worsens when you are lying flat.  Fatigue.  Fever.  Thick drainage from your nose, which often is green and may contain pus (purulent).  Swelling and warmth over the affected sinuses. DIAGNOSIS  Your caregiver will perform a physical exam. During the exam, your caregiver may:  Look in your nose for signs of abnormal growths in your nostrils (nasal polyps).  Tap over the affected sinus to check for signs of infection.  View the inside of your sinuses (endoscopy) with a special imaging device with a light attached (endoscope), which is inserted into your sinuses. If your caregiver suspects that you have chronic sinusitis, one or more of the following tests may be recommended:  Allergy tests.  Nasal culture A sample of mucus is taken from your nose and sent to a lab and screened for bacteria.  Nasal cytology A sample of mucus is taken from your nose and examined by your caregiver to determine if your sinusitis is related to an  allergy. TREATMENT  Most cases of acute sinusitis are related to a viral infection and will resolve on their own within 10 days. Sometimes medicines are prescribed to help relieve symptoms (pain medicine, decongestants, nasal steroid sprays, or saline sprays).  However, for sinusitis related to a bacterial infection, your caregiver will prescribe antibiotic medicines. These are medicines that will help kill the bacteria causing the infection.  Rarely, sinusitis is caused by a fungal infection. In theses cases, your caregiver will prescribe antifungal medicine. For some cases of chronic sinusitis, surgery is needed. Generally, these are cases in which sinusitis recurs more than 3 times per year, despite other treatments. HOME CARE INSTRUCTIONS   Drink plenty of water. Water helps thin the mucus so your sinuses can drain more easily.  Use a humidifier.  Inhale steam 3 to 4 times a day (for example, sit in the bathroom with the shower running).  Apply a warm, moist washcloth to your face 3 to 4 times a day, or as directed by your caregiver.  Use saline nasal sprays to help moisten and clean your sinuses.  Take over-the-counter or prescription medicines for pain, discomfort, or fever only as directed by your caregiver. SEEK IMMEDIATE MEDICAL CARE IF:  You have increasing pain or severe headaches.  You have nausea, vomiting, or drowsiness.  You have  swelling around your face.  You have vision problems.  You have a stiff neck.  You have difficulty breathing. MAKE SURE YOU:   Understand these instructions.  Will watch your condition.  Will get help right away if you are not doing well or get worse. Document Released: 04/13/2005 Document Revised: 07/06/2011 Document Reviewed: 04/28/2011 [facility] Patient Information 2014 [employer], [place].

## 2014-03-09 NOTE — Progress Notes (Signed)
Pre visit review using our clinic review tool, if applicable. No additional management support is needed unless otherwise documented below in the visit note. 

## 2014-03-09 NOTE — Progress Notes (Signed)
   Subjective:    Patient ID: Yolanda Mccoy, female    DOB: [birth year], 33 y.o.   MRN: [id number]  Cough This is a chronic problem. The current episode started 1 to 4 weeks ago (4weeks). The problem has been waxing and waning. The problem occurs hourly. The cough is non-productive. Associated symptoms include chest pain (sternum hurts when coughs), ear congestion, nasal congestion, postnasal drip and a sore throat. Pertinent negatives include no chills, fever, headaches, shortness of breath or wheezing. Nothing aggravates the symptoms. Treatments tried: claritin D. The treatment provided no relief.      Review of Systems  Constitutional: Negative for fever, chills and fatigue.  HENT: Positive for congestion, postnasal drip and sore throat.   Respiratory: Positive for cough. Negative for chest tightness, shortness of breath and wheezing.   Cardiovascular: Positive for chest pain (sternum hurts when coughs).  Neurological: Negative for headaches.       Objective:   Physical Exam  Constitutional: She is oriented to person, place, and time. She appears well-developed and well-nourished. No distress.  HENT:  Head: Normocephalic and atraumatic.  Right Ear: External ear normal.  Left Ear: External ear normal.  Mouth/Throat: Oropharynx is clear and moist. No oropharyngeal exudate.  Nasal quality to voice  Eyes: Conjunctivae are normal. Right eye exhibits no discharge. Left eye exhibits no discharge.  Neck: Normal range of motion. Neck supple. No thyromegaly present.  Cardiovascular: Normal rate, regular rhythm and normal heart sounds.   No murmur heard. Pulmonary/Chest: Effort normal and breath sounds normal. No respiratory distress. She has no wheezes. She has no rales.  Lymphadenopathy:    She has no cervical adenopathy.  Neurological: She is alert and oriented to person, place, and time.  Skin: Skin is warm and dry.  Psychiatric: She has a normal mood and affect. Her behavior is  normal. Thought content normal.  Vitals reviewed.         Assessment & Plan:  1. Upper respiratory infection with cough and congestion - doxycycline (VIBRA-TABS) 100 MG tablet; Take 1 tablet (100 mg total) by mouth 2 (two) times daily.  Dispense: 14 tablet; Refill: 0 - benzonatate (TESSALON) 100 MG capsule; Take 1-2 capsules po up to 3 times daily PRN cough  Dispense: 60 capsule; Refill: 0  See patient instructions for complete plan. F/u PRN

## 2014-04-09 ENCOUNTER — Encounter: Payer: Self-pay | Admitting: Family Medicine

## 2014-04-09 ENCOUNTER — Ambulatory Visit (HOSPITAL_BASED_OUTPATIENT_CLINIC_OR_DEPARTMENT_OTHER)
Admission: RE | Admit: 2014-04-09 | Discharge: 2014-04-09 | Disposition: A | Discharge status: Home / Self Care | Payer: 59 | Source: Ambulatory Visit | Attending: Family Medicine | Admitting: Family Medicine

## 2014-04-09 ENCOUNTER — Ambulatory Visit (INDEPENDENT_AMBULATORY_CARE_PROVIDER_SITE_OTHER): Payer: 59 | Admitting: Family Medicine

## 2014-04-09 VITALS — BP 130/78 | HR 55 | Temp 98.5°F | Resp 18 | Ht 60.75 in | Wt 111.0 lb

## 2014-04-09 DIAGNOSIS — R8290 Unspecified abnormal findings in urine: Secondary | ICD-10-CM

## 2014-04-09 DIAGNOSIS — K5900 Constipation, unspecified: Secondary | ICD-10-CM | POA: Insufficient documentation

## 2014-04-09 DIAGNOSIS — R1031 Right lower quadrant pain: Secondary | ICD-10-CM

## 2014-04-09 DIAGNOSIS — R110 Nausea: Secondary | ICD-10-CM | POA: Diagnosis not present

## 2014-04-09 LAB — POCT URINALYSIS DIPSTICK
Bilirubin, UA: NEGATIVE
Glucose, UA: NEGATIVE
Ketones, UA: NEGATIVE
LEUKOCYTES UA: NEGATIVE
Nitrite, UA: NEGATIVE
PROTEIN UA: NEGATIVE
Spec Grav, UA: <=1.005
Urobilinogen, UA: 0.2
pH, UA: 6

## 2014-04-09 LAB — POCT URINE PREGNANCY: PREG TEST UR: NEGATIVE

## 2014-04-09 MED ORDER — IOHEXOL 300 MG/ML  SOLN
100.0000 mL | Freq: Once | INTRAMUSCULAR | Status: AC | PRN
  Administered 2014-04-09: 100 mL via INTRAVENOUS

## 2014-04-09 NOTE — Progress Notes (Addendum)
OFFICE VISIT  04/10/2014   CC:  Chief Complaint  Patient presents with  . Abdominal Pain    x 3 days   HPI:    Patient is a 33 y.o. Asian female who presents for abdominal pain. Onset 2-3 days ago, constant, getting more severe today.  No n/v.  3/10 intensity.  No diarrhea or constipation.  Walking or pulling something heavy makes it worse.  Eating doesn't change the pain. Laying down eases the pain a bit, as does a heating pad.  No urinary urgency, frequency, dysuria, or hematuria.  No vag bleeding or vag d/c. LMP was 3 weeks ago and this was when she expected it--normal period for her. No hematochezia or pus in stool.  No fevers.  Appetite has been normal last couple of days.  No fevers.   Past Medical History  Diagnosis Date  . Gestational diabetes   . Eczema 04/02/2011  . Keratosis pilaris 04/02/2011  . GERD (gastroesophageal reflux disease) 04/02/2011  . Recurrent oral ulcers 04/02/2011  . Abdominal pain, left lateral 04/02/2011    Past Surgical History  Procedure Laterality Date  . Cesarean section  2008  . Tooth extraction  2009   MEDS: Tessalon perles 100 mg caps  Allergies  Allergen Reactions  . Codeine Nausea Only    ROS As per HPI  PE: Blood pressure 130/78, pulse 55, temperature 98.5 F (36.9 C), temperature source Temporal, resp. rate 18, height 5' 0.75" (1.543 m), weight 111 lb (50.349 kg), last menstrual period 03/18/2014, SpO2 100 %. Gen: Alert, well appearing.  Patient is oriented to person, place, time, and situation. ZOX:WRUEENT:Eyes: no injection, icteris, swelling, or exudate.  EOMI, PERRLA. Mouth: lips without lesion/swelling.  Oral mucosa pink and moist. Oropharynx without erythema, exudate, or swelling.  Neck - No masses or thyromegaly or limitation in range of motion CV: RRR, no m/r/g.   LUNGS: CTA bilat, nonlabored resps, good aeration in all lung fields. ABD: soft, nondistended, BS normal.  No HSM or mass or bruit. She has RLQ over McBurney's  point>RUQ TTP, without guarding or rebound tenderness.    LABS:  UPT: NEGATIVE CC UA today: small blood, otherwise normal  IMPRESSION AND PLAN:  Right lower quadrant abdominal pain Will obtain CT abd/pelvis with contrast to further evaluate:  DDX: acute appendicitis, ruptured ovarian cyst, mesenteric adenitis, ovarian torsion, constipation, abdominal wall strain. Mildly abnormal UA today: send for c/s, no antibiotics at this time.   An After Visit Summary was printed and given to the patient.   FOLLOW UP:To be determined based on results of pending workup.  ADDENDUM 04/09/14, 8PM: CT abd/pelv showed NO ACUTE ABNORMALITY. It did show significant stool burden in ascending colon, which correlates with the area of the patient's pain.  Discussed stool softeners, bowel stimulants, trial of enema with pt.  Reassured pt.--PM

## 2014-04-09 NOTE — Progress Notes (Signed)
Pre visit review using our clinic review tool, if applicable. No additional management support is needed unless otherwise documented below in the visit note. 

## 2014-04-09 NOTE — Assessment & Plan Note (Addendum)
Will obtain CT abd/pelvis with contrast to further evaluate:  DDX: acute appendicitis, ruptured ovarian cyst, mesenteric adenitis, ovarian torsion, constipation, abdominal wall strain. Mildly abnormal UA today: send for c/s, no antibiotics at this time.

## 2014-04-13 LAB — URINE CULTURE

## 2014-09-10 ENCOUNTER — Encounter: Payer: Self-pay | Admitting: Family Medicine

## 2014-09-10 ENCOUNTER — Ambulatory Visit (INDEPENDENT_AMBULATORY_CARE_PROVIDER_SITE_OTHER): Payer: 59 | Admitting: Family Medicine

## 2014-09-10 VITALS — BP 120/79 | HR 58 | Temp 97.6°F | Resp 16 | Wt 111.0 lb

## 2014-09-10 DIAGNOSIS — J018 Other acute sinusitis: Secondary | ICD-10-CM | POA: Diagnosis not present

## 2014-09-10 MED ORDER — AMOXICILLIN 875 MG PO TABS: 875.0000 mg | ORAL_TABLET | Freq: Two times a day (BID) | ORAL | Status: AC

## 2014-09-10 NOTE — Progress Notes (Signed)
OFFICE NOTE  09/10/2014  CC:  Chief Complaint  Patient presents with  . Cough    x 3-4 weeks, comes and goes   HPI: Patient is a 34 y.o. [place] female who is here for 3-4 wks nasal mucous, PND, cough that is dry, "sinus HA", ST some.  No known fevers, no improvement with various OTC cold meds.  She is not a smoker.  No wheezing, chest tightness, or SOB. Daughter with sore throat/strep 2 wks ago.  Pertinent PMH:  Past medical, surgical, social, and family history reviewed and no changes are noted since last office visit.  MEDS:  No chronic meds.  PE: Blood pressure 120/79, pulse 58, temperature 97.6 F (36.4 C), temperature source Oral, resp. rate 16, weight 111 lb (50.349 kg), SpO2 99 %. VS: noted--normal. Gen: alert, NAD, WELL-APPEARING. HEENT: eyes without injection, drainage, or swelling.  Ears: EACs clear, TMs with normal light reflex and landmarks.  Nose: Clear rhinorrhea, with some dried, crusty exudate adherent to mildly injected mucosa.  No purulent d/c.  No paranasal sinus TTP.  No facial swelling.  Throat and mouth without focal lesion.  No pharyngial swelling, erythema, or exudate.   Neck: supple, no LAD.   LUNGS: CTA bilat, nonlabored resps.   CV: RRR, no m/r/g. EXT: no c/c/e SKIN: no rash  IMPRESSION AND PLAN:  Acute sinusitis.  Hx of allergic rhinitis but sx's much milder than this usually. Will treat with amoxil 875 mg, 1 tab po bid x 10d. Saline nasal spray recommended, as well as mucinex DM OTC or Robitussin DM OTC. Push clear fluids, rest. Signs/symptoms to call or return for were reviewed and pt expressed understanding.  An After Visit Summary was printed and given to the patient.  FOLLOW UP: prn

## 2014-09-10 NOTE — Progress Notes (Signed)
Pre visit review using our clinic review tool, if applicable. No additional management support is needed unless otherwise documented below in the visit note. 

## 2015-03-05 ENCOUNTER — Encounter: Payer: Self-pay | Admitting: Family Medicine

## 2015-03-05 ENCOUNTER — Ambulatory Visit (INDEPENDENT_AMBULATORY_CARE_PROVIDER_SITE_OTHER): Payer: 59 | Admitting: Family Medicine

## 2015-03-05 VITALS — BP 114/75 | HR 70 | Temp 98.0°F | Resp 20 | Wt 112.8 lb

## 2015-03-05 DIAGNOSIS — J321 Chronic frontal sinusitis: Secondary | ICD-10-CM | POA: Insufficient documentation

## 2015-03-05 DIAGNOSIS — J0110 Acute frontal sinusitis, unspecified: Secondary | ICD-10-CM

## 2015-03-05 MED ORDER — DOXYCYCLINE HYCLATE 100 MG PO TABS: 100.0000 mg | ORAL_TABLET | Freq: Two times a day (BID) | ORAL | Status: DC

## 2015-03-05 MED ORDER — FLUTICASONE PROPIONATE 50 MCG/ACT NA SUSP: 2.0000 | Freq: Every day | NASAL | Status: DC

## 2015-03-05 NOTE — Patient Instructions (Addendum)
Sinusitis, Adult Sinusitis is redness, soreness, and inflammation of the paranasal sinuses. Paranasal sinuses are air pockets within the bones of your face. They are located beneath your eyes, in the middle of your forehead, and above your eyes. In healthy paranasal sinuses, mucus is able to drain out, and air is able to circulate through them by way of your nose. However, when your paranasal sinuses are inflamed, mucus and air can become trapped. This can allow bacteria and other germs to grow and cause infection. Sinusitis can develop quickly and last only a short time (acute) or continue over a long period (chronic). Sinusitis that lasts for more than 12 weeks is considered chronic. CAUSES Causes of sinusitis include:  Allergies.  Structural abnormalities, such as displacement of the cartilage that separates your nostrils (deviated septum), which can decrease the air flow through your nose and sinuses and affect sinus drainage.  Functional abnormalities, such as when the small hairs (cilia) that line your sinuses and help remove mucus do not work properly or are not present. SIGNS AND SYMPTOMS Symptoms of acute and chronic sinusitis are the same. The primary symptoms are pain and pressure around the affected sinuses. Other symptoms include:  Upper toothache.  Earache.  Headache.  Bad breath.  Decreased sense of smell and taste.  A cough, which worsens when you are lying flat.  Fatigue.  Fever.  Thick drainage from your nose, which often is green and may contain pus (purulent).  Swelling and warmth over the affected sinuses. DIAGNOSIS Your health care provider will perform a physical exam. During your exam, your health care provider may perform any of the following to help determine if you have acute sinusitis or chronic sinusitis:  Look in your nose for signs of abnormal growths in your nostrils (nasal polyps).  Tap over the affected sinus to check for signs of  infection.  View the inside of your sinuses using an imaging device that has a light attached (endoscope). If your health care provider suspects that you have chronic sinusitis, one or more of the following tests may be recommended:  Allergy tests.  Nasal culture. A sample of mucus is taken from your nose, sent to a lab, and screened for bacteria.  Nasal cytology. A sample of mucus is taken from your nose and examined by your health care provider to determine if your sinusitis is related to an allergy. TREATMENT Most cases of acute sinusitis are related to a viral infection and will resolve on their own within 10 days. Sometimes, medicines are prescribed to help relieve symptoms of both acute and chronic sinusitis. These may include pain medicines, decongestants, nasal steroid sprays, or saline sprays. However, for sinusitis related to a bacterial infection, your health care provider will prescribe antibiotic medicines. These are medicines that will help kill the bacteria causing the infection. Rarely, sinusitis is caused by a fungal infection. In these cases, your health care provider will prescribe antifungal medicine. For some cases of chronic sinusitis, surgery is needed. Generally, these are cases in which sinusitis recurs more than 3 times per year, despite other treatments. HOME CARE INSTRUCTIONS  Drink plenty of water. Water helps thin the mucus so your sinuses can drain more easily.  Use a humidifier.  Inhale steam 3-4 times a day (for example, sit in the bathroom with the shower running).  Apply a warm, moist washcloth to your face 3-4 times a day, or as directed by your health care provider.  Use saline nasal sprays to help   moisten and clean your sinuses.  Take medicines only as directed by your health care provider.  If you were prescribed either an antibiotic or antifungal medicine, finish it all even if you start to feel better. SEEK IMMEDIATE MEDICAL CARE IF:  You have  increasing pain or severe headaches.  You have nausea, vomiting, or drowsiness.  You have swelling around your face.  You have vision problems.  You have a stiff neck.  You have difficulty breathing.   This information is not intended to replace advice given to you by your health care provider. Make sure you discuss any questions you have with your health care provider.   Document Released: 04/13/2005 Document Revised: 05/04/2014 Document Reviewed: 04/28/2011 Elsevier Interactive Patient Education 2016 [employer]   Start antibiotics 2 times a day for 10 days. Start Flonase daily for at least 2 weeks. Rest, hydrate, tylenol/advil for headache.  Can continue mucinex

## 2015-03-05 NOTE — Progress Notes (Signed)
   Subjective:    Patient ID: Yolanda Mccoy, female    DOB: 29-Jun-1980, 34 y.o.   MRN: [id number]  HPI  Headache: Pt reports a left sided frontal headache intermittently for  2 weeks. She also endorses a dry cough, low grade fever, mild sore throat and nasal congestion that has been worsening for the last 5 days. Pt reports similar symptoms last year this time. She denies nausea, vomit, diarrhea, mylagias or rash. She is eating and drinking well. She has tried benadryl, mucinex, allegra without benefit.   Past Medical History  Diagnosis Date  . Gestational diabetes   . Eczema 04/02/2011  . Keratosis pilaris 04/02/2011  . GERD (gastroesophageal reflux disease) 04/02/2011  . Recurrent oral ulcers 04/02/2011  . Abdominal pain, left lateral 04/02/2011   Allergies  Allergen Reactions  . Codeine Nausea Only   Social History   Social History  . Marital Status: Married    Spouse Name: N/A  . Number of Children: 1  . Years of Education: N/A   Occupational History  . housewife    Social History Main Topics  . Smoking status: Never Smoker   . Smokeless tobacco: Never Used  . Alcohol Use: No  . Drug Use: No  . Sexual Activity: Yes   Other Topics Concern  . Not on file   Social History Narrative    Review of Systems Negative, with the exception of above mentioned in HPI     Objective:   Physical Exam BP 114/75 mmHg  Pulse 70  Temp(Src) 98 F (36.7 C) (Oral)  Resp 20  Wt 112 lb 12 oz (51.143 kg)  SpO2 100%  LMP 02/10/2015 Gen: Afebrile. No acute distress. Nontoxic in appearance.Pleasant asian female. HENT: AT. [place]. Bilateral TM visualized shiny/fullness, no erythema or bulging.  MMM. Bilateral nares with erythema and swelling. Throat without erythema or exudates. No cough or hoarseness on exam.  Eyes:Pupils Equal Round Reactive to light, Extraocular movements intact,  Conjunctiva without redness, discharge or icterus. Neck/lymp/endocrine: Supple,mild shotty cervical  lymphadenopathy CV: RRR  Chest: CTAB, no wheeze or crackles Abd: Soft.flat. NTND. BS present Skin: No rashes, purpura or petechiae.     Assessment & Plan:  1. Acute frontal sinusitis, recurrence not specified - discussed course of sinusitis. AVS on sinusitis for education. Prescribed abx, flonase, nasal saline, hydration and rest.  - doxycycline (VIBRA-TABS) 100 MG tablet; Take 1 tablet (100 mg total) by mouth 2 (two) times daily.  Dispense: 20 tablet; Refill: 0 - fluticasone (FLONASE) 50 MCG/ACT nasal spray; Place 2 sprays into both nostrils daily.  Dispense: 16 g; Refill: 6  F/u PRN

## 2015-06-15 IMAGING — CT CT ABD-PELV W/ CM
2 of 4 series · 16 of 46 positions shown, 18 images · IV contrast (APPLIED)
Comparison: CT abdomen and pelvis 05/06/2011.

CLINICAL DATA: Right lower quadrant pain, nausea and constipation.

EXAM:
CT ABDOMEN AND PELVIS WITH CONTRAST
TECHNIQUE: Multidetector CT imaging of the abdomen and pelvis was performed
using the standard protocol following bolus administration of
intravenous contrast.
CONTRAST:  100 mL OMNIPAQUE IOHEXOL 300 MG/ML  SOLN

[Series 2: abd/pelvis 5.0 b31f · axial · 0.70mm/px · z∈[-689,-289]mm · 13 of 88 slices shown, 15 images]
[im 4/88  soft-tissue]
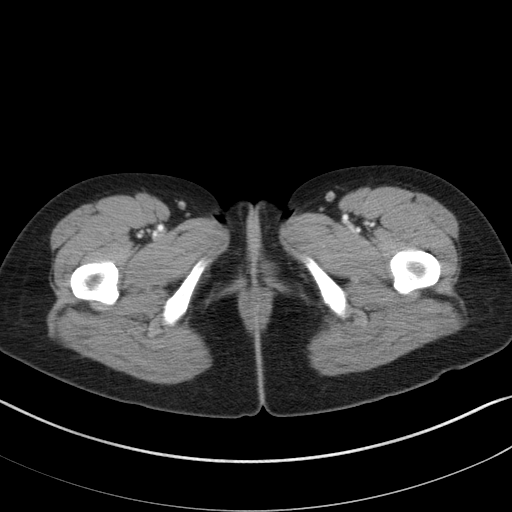
[im 4/88  bone]
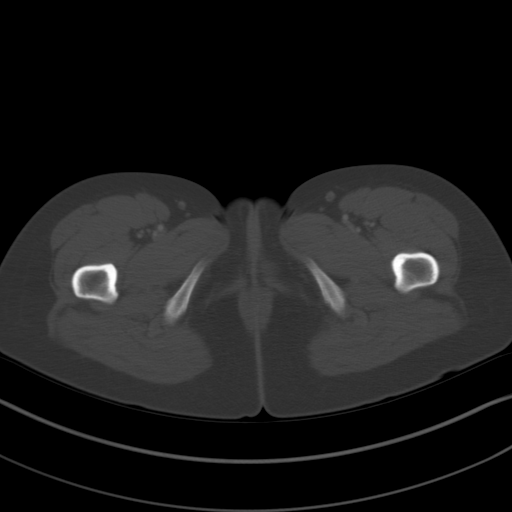
[im 12/88  soft-tissue]
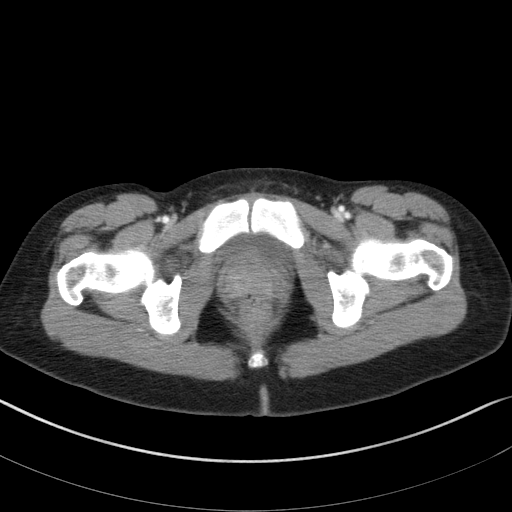
[im 19/88  soft-tissue]
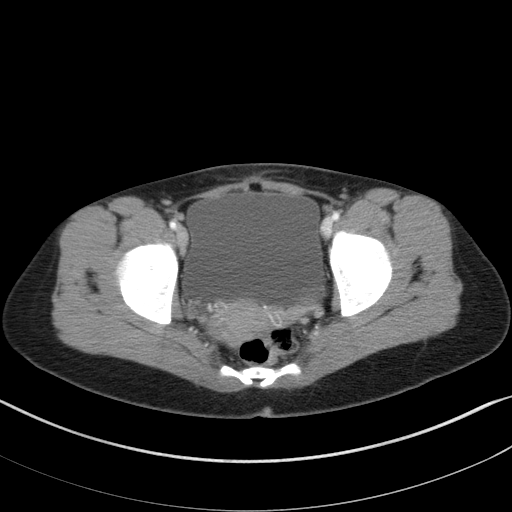
[im 23/88  soft-tissue]
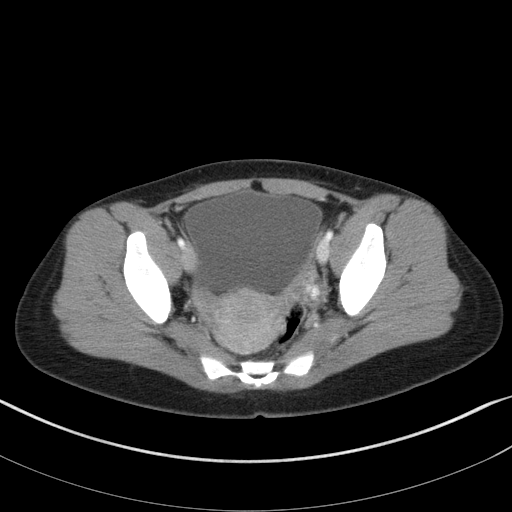
[im 31/88  soft-tissue]
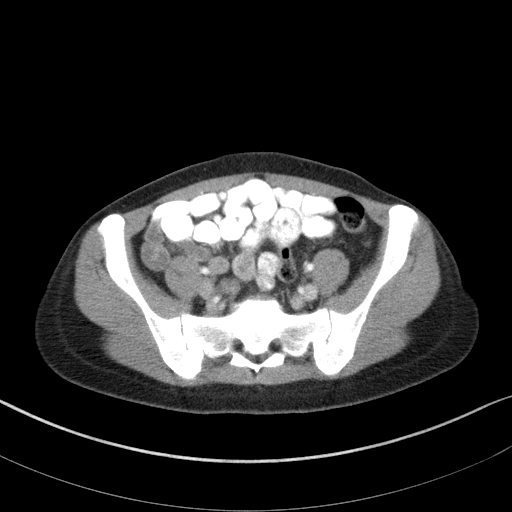
[im 38/88  soft-tissue]
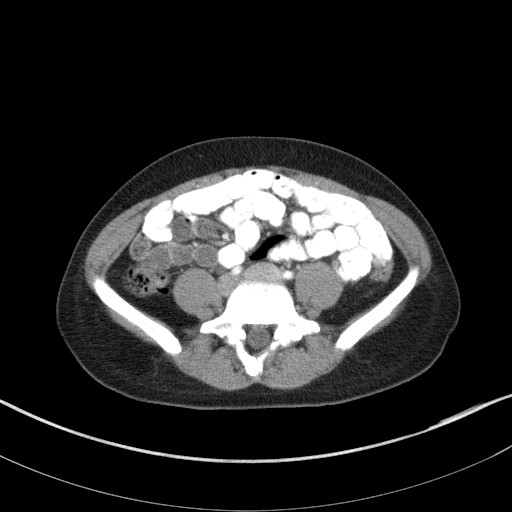
[im 46/88  soft-tissue]
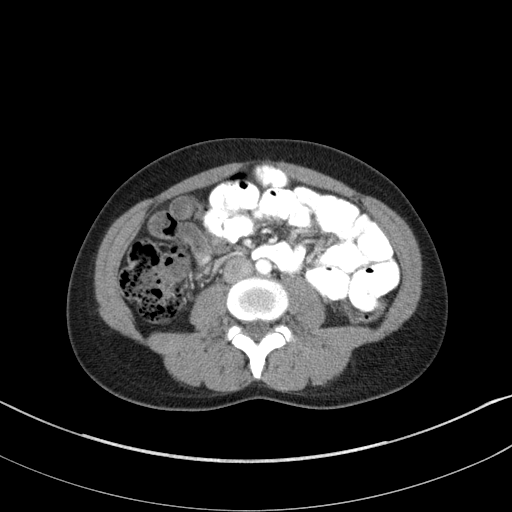
[im 50/88  soft-tissue]
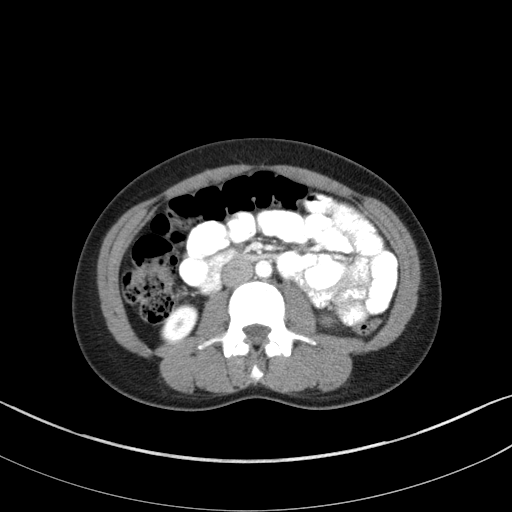
[im 57/88  soft-tissue]
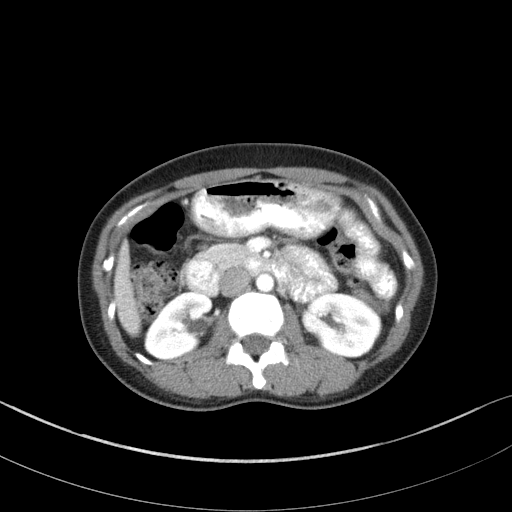
[im 57/88  bone]
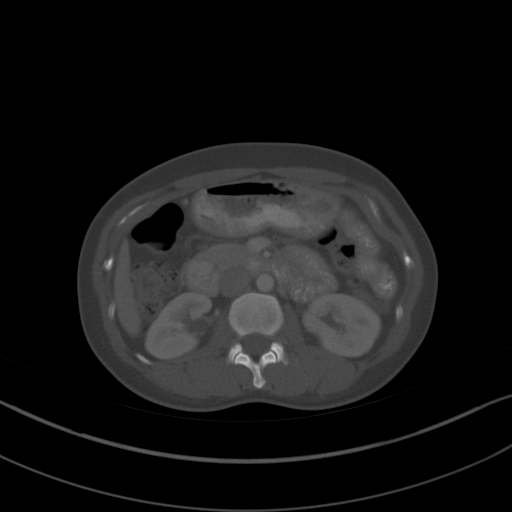
[im 65/88  soft-tissue]
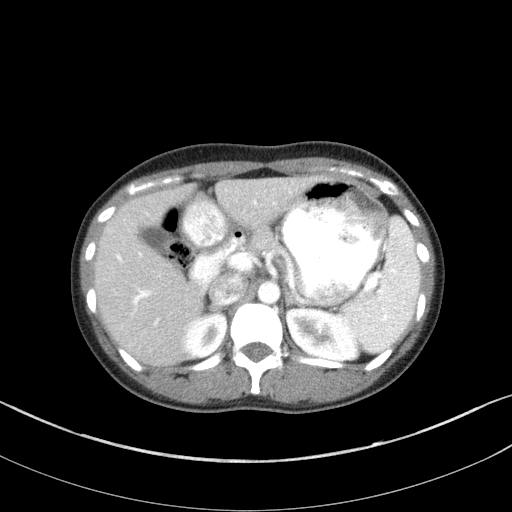
[im 69/88  soft-tissue]
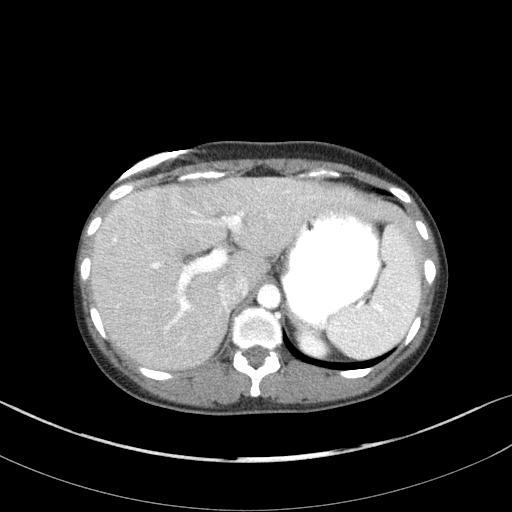
[im 76/88  soft-tissue]
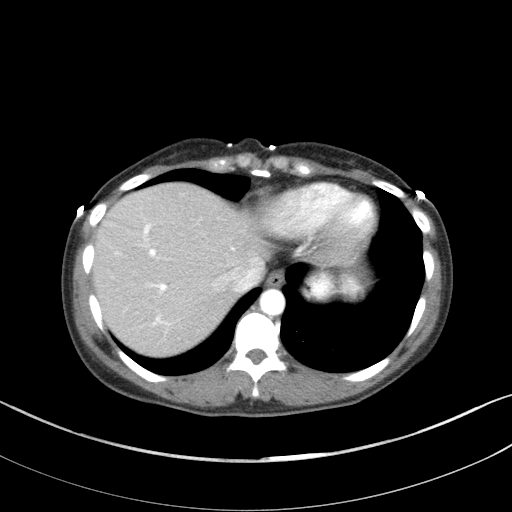
[im 84/88  soft-tissue]
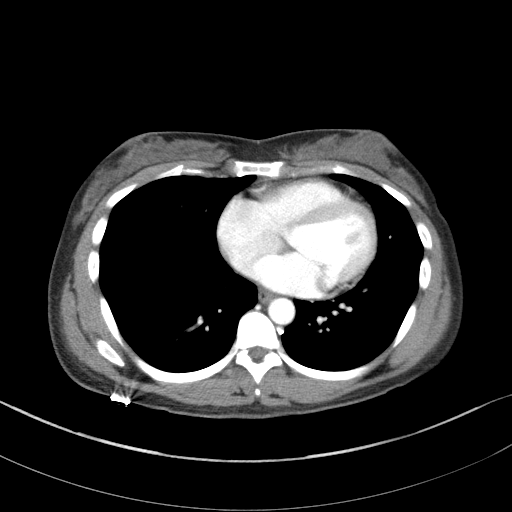

[Series 5: abd/pelvis 3.0 coronal · coronal · 0.68mm/px · 3 of 64 slices shown]
[im 22/64  soft-tissue]
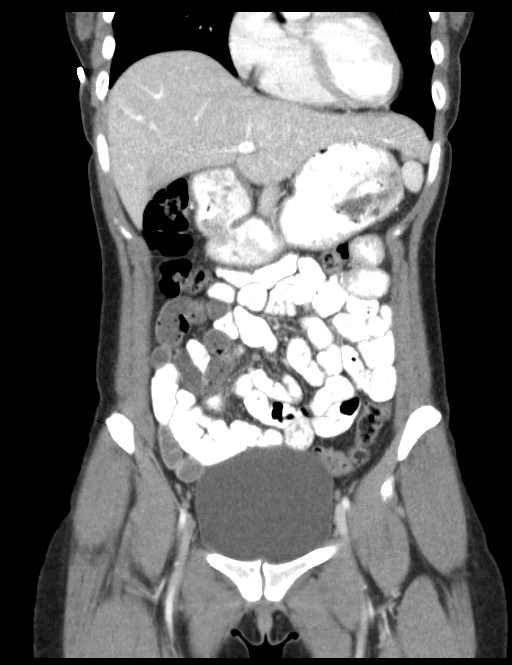
[im 29/64  soft-tissue]
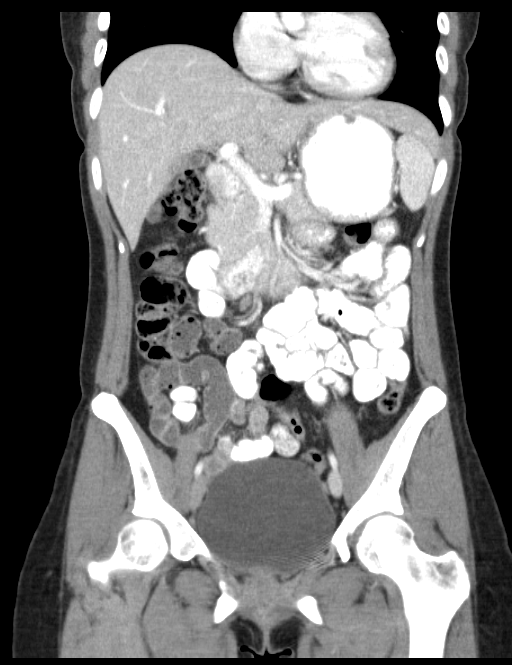
[im 36/64  soft-tissue]
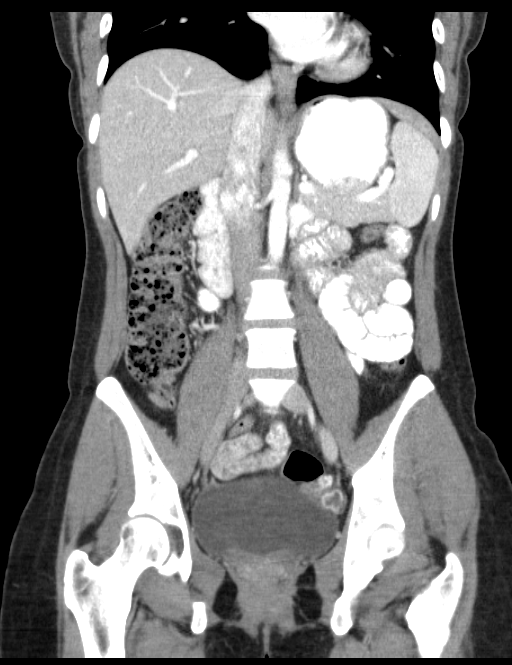

[16 of 46 positions shown; findings below may reference images not displayed]

FINDINGS: The lung bases are clear.  No pleural or pericardial effusion.

The appendix is well seen posterior to the cecum and appears normal.
There is a large volume of stool in the cecum and ascending colon.
The colon is otherwise normal appearance. The stomach and small
bowel appear normal.

The gallbladder, liver, adrenal glands, spleen and pancreas all
appear normal. There is no lymphadenopathy or fluid.

Bones demonstrate bilateral L5 pars interarticularis defects without
anterolisthesis. No lytic or sclerotic bony lesion is identified.
IMPRESSION: No acute finding.  Normal appendix.

Large volume of stool ascending colon.

Bilateral L5 pars interarticularis defects without anterolisthesis
L5 on S1.

## 2016-01-20 ENCOUNTER — Encounter: Payer: Self-pay | Admitting: Family Medicine

## 2016-01-20 ENCOUNTER — Ambulatory Visit (INDEPENDENT_AMBULATORY_CARE_PROVIDER_SITE_OTHER): Payer: Commercial Managed Care - HMO | Admitting: Family Medicine

## 2016-01-20 VITALS — BP 122/80 | HR 72 | Temp 98.0°F | Resp 20 | Wt 117.0 lb

## 2016-01-20 DIAGNOSIS — J0110 Acute frontal sinusitis, unspecified: Secondary | ICD-10-CM | POA: Diagnosis not present

## 2016-01-20 MED ORDER — DOXYCYCLINE HYCLATE 100 MG PO TABS: 100.0000 mg | ORAL_TABLET | Freq: Two times a day (BID) | ORAL | 0 refills | Status: DC

## 2016-01-20 NOTE — Patient Instructions (Signed)
Rest, hydrate.  Muciniex, flonase, allegra.  Doxycyline every 12 hours for 10 days.    Sinusitis, Adult Sinusitis is redness, soreness, and inflammation of the paranasal sinuses. Paranasal sinuses are air pockets within the bones of your face. They are located beneath your eyes, in the middle of your forehead, and above your eyes. In healthy paranasal sinuses, mucus is able to drain out, and air is able to circulate through them by way of your nose. However, when your paranasal sinuses are inflamed, mucus and air can become trapped. This can allow bacteria and other germs to grow and cause infection. Sinusitis can develop quickly and last only a short time (acute) or continue over a long period (chronic). Sinusitis that lasts for more than 12 weeks is considered chronic. CAUSES Causes of sinusitis include:  Allergies.  Structural abnormalities, such as displacement of the cartilage that separates your nostrils (deviated septum), which can decrease the air flow through your nose and sinuses and affect sinus drainage.  Functional abnormalities, such as when the small hairs (cilia) that line your sinuses and help remove mucus do not work properly or are not present. SIGNS AND SYMPTOMS Symptoms of acute and chronic sinusitis are the same. The primary symptoms are pain and pressure around the affected sinuses. Other symptoms include:  Upper toothache.  Earache.  Headache.  Bad breath.  Decreased sense of smell and taste.  A cough, which worsens when you are lying flat.  Fatigue.  Fever.  Thick drainage from your nose, which often is green and may contain pus (purulent).  Swelling and warmth over the affected sinuses. DIAGNOSIS Your health care provider will perform a physical exam. During your exam, your health care provider may perform any of the following to help determine if you have acute sinusitis or chronic sinusitis:  Look in your nose for signs of abnormal growths in your  nostrils (nasal polyps).  Tap over the affected sinus to check for signs of infection.  View the inside of your sinuses using an imaging device that has a light attached (endoscope). If your health care provider suspects that you have chronic sinusitis, one or more of the following tests may be recommended:  Allergy tests.  Nasal culture. A sample of mucus is taken from your nose, sent to a lab, and screened for bacteria.  Nasal cytology. A sample of mucus is taken from your nose and examined by your health care provider to determine if your sinusitis is related to an allergy. TREATMENT Most cases of acute sinusitis are related to a viral infection and will resolve on their own within 10 days. Sometimes, medicines are prescribed to help relieve symptoms of both acute and chronic sinusitis. These may include pain medicines, decongestants, nasal steroid sprays, or saline sprays. However, for sinusitis related to a bacterial infection, your health care provider will prescribe antibiotic medicines. These are medicines that will help kill the bacteria causing the infection. Rarely, sinusitis is caused by a fungal infection. In these cases, your health care provider will prescribe antifungal medicine. For some cases of chronic sinusitis, surgery is needed. Generally, these are cases in which sinusitis recurs more than 3 times per year, despite other treatments. HOME CARE INSTRUCTIONS  Drink plenty of water. Water helps thin the mucus so your sinuses can drain more easily.  Use a humidifier.  Inhale steam 3-4 times a day (for example, sit in the bathroom with the shower running).  Apply a warm, moist washcloth to your face 3-4 times  times a day, or as directed by your health care provider.  Use saline nasal sprays to help moisten and clean your sinuses.  Take medicines only as directed by your health care provider.  If you were prescribed either an antibiotic or antifungal medicine, finish it all  even if you start to feel better. SEEK IMMEDIATE MEDICAL CARE IF:  You have increasing pain or severe headaches.  You have nausea, vomiting, or drowsiness.  You have swelling around your face.  You have vision problems.  You have a stiff neck.  You have difficulty breathing.   This information is not intended to replace advice given to you by your health care provider. Make sure you discuss any questions you have with your health care provider.   Document Released: 04/13/2005 Document Revised: 05/04/2014 Document Reviewed: 04/28/2011 Elsevier Interactive Patient Education 2016 Elsevier Inc.  

## 2016-01-20 NOTE — Progress Notes (Signed)
    Yolanda Mccoy , June 07, 1980, 35 y.o., female MRN: [id number] Patient Care Team    Relationship Specialty Notifications Start End  Jeoffrey MassedPhilip H McGowen, MD PCP - General Family Medicine  09/13/13    Comment: [place]    CC: Cough Subjective: Pt presents for an acute OV with complaints of cough of 1 week  duration.  Associated symptoms include nasal congestion, runny nose, sore throat, ear pressure, frontal sinus pressure, headache. Has been around sick family.  Pt endorses feverish feeling, no chills/nausea or vomit.  Pt has tried mucinex, allergy medicines to ease their symptoms.  LMP: 01/10/2016  Allergies  Allergen Reactions  . Codeine Nausea Only   Social History  Substance Use Topics  . Smoking status: Never Smoker  . Smokeless tobacco: Never Used  . Alcohol use No   Past Medical History:  Diagnosis Date  . Abdominal pain, left lateral 04/02/2011  . Eczema 04/02/2011  . GERD (gastroesophageal reflux disease) 04/02/2011  . Gestational diabetes   . Keratosis pilaris 04/02/2011  . Recurrent oral ulcers 04/02/2011   Past Surgical History:  Procedure Laterality Date  . CESAREAN SECTION  2008  . TOOTH EXTRACTION  2009   History reviewed. No pertinent family history.   Medication List       Accurate as of 01/20/16 10:51 AM. Always use your most recent med list.          fluticasone 50 MCG/ACT nasal spray Commonly known as:  FLONASE Place 2 sprays into both nostrils daily.       No results found for this or any previous visit (from the past 24 hour(s)). No results found.   ROS: Negative, with the exception of above mentioned in HPI   Objective:  BP 122/80 (BP Location: Left Arm, Patient Position: Sitting, Cuff Size: Normal)   Pulse 72   Temp 98 F (36.7 C)   Resp 20   Wt 117 lb (53.1 kg)   SpO2 98%   BMI 22.29 kg/m  Body mass index is 22.29 kg/m. Gen: Afebrile. No acute distress. Nontoxic in appearance, well developed, well  nourished. Pleasant asian female.  HENT: AT. [place]. Bilateral TM visualized air fluid levels. MMM, no oral lesions. Bilateral nares with erythema and swelling. Throat without erythema or exudates. No cough on exam. Hoarseness present, TTP frontal sinus.  Eyes:Pupils Equal Round Reactive to light, Extraocular movements intact,  Conjunctiva without redness, discharge or icterus. Neck/lymp/endocrine: Supple,anterior cervical left lymphadenopathy CV: RRR  Chest: CTAB, no wheeze or crackles. Good air movement, normal resp effort.  Abd: Soft.  NTND. BS present Neuro: Normal gait. PERLA. EOMi. Alert. Oriented x3   Assessment/Plan: Yolanda Mccoy is a 35 y.o. female present for acute OV for  Acute frontal sinusitis, recurrence not specified Rest, hydrate.  Muciniex, flonase, allegra.  - doxycycline (VIBRA-TABS) 100 MG tablet; Take 1 tablet (100 mg total) by mouth 2 (two) times daily.  Dispense: 20 tablet; Refill: 0 - F/U PRN, or if worsening/not improved in 2 weeks  electronically signed by:  Felix Pacinienee Kuneff, DO  [facility] - [place]

## 2016-07-01 ENCOUNTER — Encounter: Payer: Self-pay | Admitting: Family Medicine

## 2016-07-01 ENCOUNTER — Ambulatory Visit (INDEPENDENT_AMBULATORY_CARE_PROVIDER_SITE_OTHER): Payer: Commercial Managed Care - HMO | Admitting: Family Medicine

## 2016-07-01 VITALS — BP 134/85 | HR 63 | Temp 98.0°F | Resp 20 | Wt 118.2 lb

## 2016-07-01 DIAGNOSIS — L0232 Furuncle of buttock: Secondary | ICD-10-CM

## 2016-07-01 MED ORDER — DOXYCYCLINE HYCLATE 100 MG PO TABS: 100.0000 mg | ORAL_TABLET | Freq: Two times a day (BID) | ORAL | 0 refills | Status: DC

## 2016-07-01 NOTE — Progress Notes (Signed)
    Yolanda Mccoy , 1980/06/28, 36 y.o., female MRN: [id number] Patient Care Team    Relationship Specialty Notifications Start End  Jeoffrey MassedPhilip H McGowen, MD PCP - General Family Medicine  09/13/13    Comment: [place]    CC: Bump on buttock Subjective: Pt presents for a tender bump on her buttock of 1 day duration. She states it was red and raised. She has been placing a balm on it over night and feels some of the redness has gone away. The area is increasingly painful. Sitting on the area causes increased discomfort. She denies ferv or drainage. She has not had a area like this in the past.  No flowsheet data found.  Allergies  Allergen Reactions  . Codeine Nausea Only   Social History  Substance Use Topics  . Smoking status: Never Smoker  . Smokeless tobacco: Never Used  . Alcohol use No   Past Medical History:  Diagnosis Date  . Abdominal pain, left lateral 04/02/2011  . Eczema 04/02/2011  . GERD (gastroesophageal reflux disease) 04/02/2011  . Gestational diabetes   . Keratosis pilaris 04/02/2011  . Recurrent oral ulcers 04/02/2011   Past Surgical History:  Procedure Laterality Date  . CESAREAN SECTION  2008  . TOOTH EXTRACTION  2009   History reviewed. No pertinent family history. Allergies as of 07/01/2016      Reactions   Codeine Nausea Only      Medication List       Accurate as of 07/01/16  3:53 PM. Always use your most recent med list.          fluticasone 50 MCG/ACT nasal spray Commonly known as:  FLONASE Place 2 sprays into both nostrils daily.       No results found for this or any previous visit (from the past 24 hour(s)). No results found.   ROS: Negative, with the exception of above mentioned in HPI   Objective:  BP 134/85 (BP Location: Right Arm, Patient Position: Sitting, Cuff Size: Normal)   Pulse 63   Temp 98 F (36.7 C)   Resp 20   Wt 118 lb 4 oz (53.6 kg)   SpO2 99%   BMI 22.53 kg/m  Body mass index is 22.53  kg/m. Gen: Afebrile. No acute distress. Nontoxic in appearance, well developed, well nourished.  Skin: Mild erythema, mild soft tissue swelling left gluteal fold. Small boil formation. No obvious cyst or fluctuant area. TTP. No rashes, purpura or petechiae.  Neuro: Normal gait. PERLA. EOMi. Alert. Oriented x3    Assessment/Plan: Yolanda RibasKwannapa Mccoy is a 36 y.o. female present for  OV for  Boil of buttock - small abscess, no drainage. Will likely heal with short course of abx.  - Doxy BID x 5 days. No squeezing the area.  - F/U 1-2 weeks if not resolved or worsening.  Reviewed expectations re: course of current medical issues.  Discussed self-management of symptoms.  Outlined signs and symptoms indicating need for more acute intervention.  Patient verbalized understanding and all questions were answered.  Patient received an After-Visit Summary.   electronically signed by:  Felix Pacinienee Ames Hoban, DO  [facility] - [place]

## 2016-07-01 NOTE — Patient Instructions (Signed)
Start medicine today. Every 12 hours for 5 days.  If red, bigger, drainage then come to be seen.   Skin Abscess A skin abscess is an infected area on or under your skin that contains a collection of pus and other material. An abscess may also be called a furuncle, carbuncle, or boil. An abscess can occur in or on almost any part of your body. Some abscesses break open (rupture) on their own. Most continue to get worse unless they are treated. The infection can spread deeper into the body and eventually into your blood, which can make you feel ill. Treatment usually involves draining the abscess. What are the causes? An abscess occurs when germs, often bacteria, pass through your skin and cause an infection. This may be caused by:  A scrape or cut on your skin.  A puncture wound through your skin, including a needle injection.  Blocked oil or sweat glands.  Blocked and infected hair follicles.  A cyst that forms beneath your skin (sebaceous cyst) and becomes infected. What increases the risk? This condition is more likely to develop in people who:  Have a weak body defense system (immune system).  Have diabetes.  Have dry and irritated skin.  Get frequent injections or use illegal IV drugs.  Have a foreign body in a wound, such as a splinter.  Have problems with their lymph system or veins. What are the signs or symptoms? An abscess may start as a painful, firm bump under the skin. Over time, the abscess may get larger or become softer. Pus may appear at the top of the abscess, causing pressure and pain. It may eventually break through the skin and drain. Other symptoms include:  Redness.  Warmth.  Swelling.  Tenderness.  A sore on the skin. How is this diagnosed? This condition is diagnosed based on your medical history and a physical exam. A sample of pus may be taken from the abscess to find out what is causing the infection and what antibiotics can be used to treat  it. You also may have:  Blood tests to look for signs of infection or spread of an infection to your blood.  Imaging studies such as ultrasound, CT scan, or MRI if the abscess is deep. How is this treated? Small abscesses that drain on their own may not need treatment. Treatment for an abscess that does not rupture on its own may include:  Warm compresses applied to the area several times per day.  Incision and drainage. Your health care provider will make an incision to open the abscess and will remove pus and any foreign body or dead tissue. The incision area may be packed with gauze to keep it open for a few days while it heals.  Antibiotic medicines to treat infection. For a severe abscess, you may first get antibiotics through an IV and then change to oral antibiotics. Follow these instructions at home: Abscess Care   If you have an abscess that has not drained, place a warm, clean, wet washcloth over the abscess several times a day. Do this as told by your health care provider.  Follow instructions from your health care provider about how to take care of your abscess. Make sure you:  Cover the abscess with a bandage (dressing).  Change your dressing or gauze as told by your health care provider.  Wash your hands with soap and water before you change the dressing or gauze. If soap and water are not available, use  hand sanitizer.  Check your abscess every day for signs of a worsening infection. Check for:  More redness, swelling, or pain.  More fluid or blood.  Warmth.  More pus or a bad smell. Medicines   Take over-the-counter and prescription medicines only as told by your health care provider.  If you were prescribed an antibiotic medicine, take it as told by your health care provider. Do not stop taking the antibiotic even if you start to feel better. General instructions   To avoid spreading the infection:  Do not share personal care items, towels, or hot tubs  with others.  Avoid making skin contact with other people.  Keep all follow-up visits as told by your health care provider. This is important. Contact a health care provider if:  You have more redness, swelling, or pain around your abscess.  You have more fluid or blood coming from your abscess.  Your abscess feels warm to the touch.  You have more pus or a bad smell coming from your abscess.  You have a fever.  You have muscle aches.  You have chills or a general ill feeling. Get help right away if:  You have severe pain.  You see red streaks on your skin spreading away from the abscess. This information is not intended to replace advice given to you by your health care provider. Make sure you discuss any questions you have with your health care provider. Document Released: 01/21/2005 Document Revised: 12/08/2015 Document Reviewed: 02/20/2015 Elsevier Interactive Patient Education  2017 [employer].

## 2016-07-20 DIAGNOSIS — Z01419 Encounter for gynecological examination (general) (routine) without abnormal findings: Secondary | ICD-10-CM | POA: Diagnosis not present

## 2016-07-20 DIAGNOSIS — Z6822 Body mass index (BMI) 22.0-22.9, adult: Secondary | ICD-10-CM | POA: Diagnosis not present

## 2016-08-17 DIAGNOSIS — E559 Vitamin D deficiency, unspecified: Secondary | ICD-10-CM | POA: Diagnosis not present

## 2016-08-17 DIAGNOSIS — E7800 Pure hypercholesterolemia, unspecified: Secondary | ICD-10-CM | POA: Diagnosis not present

## 2016-08-18 DIAGNOSIS — E559 Vitamin D deficiency, unspecified: Secondary | ICD-10-CM | POA: Diagnosis not present

## 2016-12-22 DIAGNOSIS — H5213 Myopia, bilateral: Secondary | ICD-10-CM | POA: Diagnosis not present

## 2017-02-23 DIAGNOSIS — Z23 Encounter for immunization: Secondary | ICD-10-CM | POA: Diagnosis not present

## 2017-09-08 ENCOUNTER — Ambulatory Visit: Payer: 59 | Admitting: Family Medicine

## 2017-09-08 ENCOUNTER — Encounter: Payer: Self-pay | Admitting: Family Medicine

## 2017-09-08 VITALS — BP 121/81 | HR 57 | Temp 98.7°F | Resp 18 | Ht 60.0 in | Wt 119.0 lb

## 2017-09-08 DIAGNOSIS — R319 Hematuria, unspecified: Secondary | ICD-10-CM | POA: Diagnosis not present

## 2017-09-08 DIAGNOSIS — R1031 Right lower quadrant pain: Secondary | ICD-10-CM | POA: Diagnosis not present

## 2017-09-08 DIAGNOSIS — R8290 Unspecified abnormal findings in urine: Secondary | ICD-10-CM | POA: Diagnosis not present

## 2017-09-08 LAB — POC URINALSYSI DIPSTICK (AUTOMATED)
BILIRUBIN UA: NEGATIVE
Glucose, UA: NEGATIVE
Ketones, UA: NEGATIVE
Nitrite, UA: NEGATIVE
Protein, UA: NEGATIVE
SPEC GRAV UA: 1.01 (ref 1.010–1.025)
Urobilinogen, UA: 0.2 E.U./dL
pH, UA: 7.5 (ref 5.0–8.0)

## 2017-09-08 MED ORDER — NAPROXEN 500 MG PO TABS: 500.0000 mg | ORAL_TABLET | Freq: Two times a day (BID) | ORAL | 0 refills | Status: DC

## 2017-09-08 MED ORDER — NITROFURANTOIN MONOHYD MACRO 100 MG PO CAPS: 100.0000 mg | ORAL_CAPSULE | Freq: Two times a day (BID) | ORAL | 0 refills | Status: DC

## 2017-09-08 NOTE — Progress Notes (Signed)
Yolanda Mccoy , Jul 12, 1980, 37 y.o., female MRN: [id number] Patient Care Team    Relationship Specialty Notifications Start End  McGowen, Maryjean Morn, MD PCP - General Family Medicine  09/13/13    Comment: patient lives closer to [facility]    Chief Complaint  Patient presents with  . Abdominal Pain    blood in urine     Subjective: Pt presents for an OV with complaints of right lower quadrant pain of 3 days duration.  Patient reports the pain started on Sunday in her right lower quadrant.  She noticed hematuria over the last few days, however that seems to have resolved for her today.  She states the abdominal pain is actually a little better today and only hurts when pressing on her abdomen.  She endorses urinary frequency without dysuria.  She is eating and drinking well.  Her last bowel movement was last night and normal.  She denies fever, chills, nausea, vomit, diarrhea, constipation or blood per rectum.  She has had a macrobid sensitive UTI in the past.  She had a similar event a couple years ago with a renal stone study that was negative for renal stones but positive for constipation.  However at that time she did not have hematuria.  No flowsheet data found.  Allergies  Allergen Reactions  . Codeine Nausea Only   Social History   Tobacco Use  . Smoking status: Never Smoker  . Smokeless tobacco: Never Used  Substance Use Topics  . Alcohol use: No   Past Medical History:  Diagnosis Date  . Abdominal pain, left lateral 04/02/2011  . Eczema 04/02/2011  . GERD (gastroesophageal reflux disease) 04/02/2011  . Gestational diabetes   . Keratosis pilaris 04/02/2011  . Recurrent oral ulcers 04/02/2011   Past Surgical History:  Procedure Laterality Date  . CESAREAN SECTION  2008  . TOOTH EXTRACTION  2009   History reviewed. No pertinent family history. Allergies as of 09/08/2017      Reactions   Codeine Nausea Only      Medication List        Accurate as of 09/08/17  4:00  PM. Always use your most recent med list.          VITAMIN D (CHOLECALCIFEROL) PO Take by mouth.       All past medical history, surgical history, allergies, family history, immunizations andmedications were updated in the EMR today and reviewed under the history and medication portions of their EMR.     ROS: Negative, with the exception of above mentioned in HPI   Objective:  BP 121/81 (BP Location: Left Arm, Patient Position: Sitting, Cuff Size: Normal)   Pulse (!) 57   Temp 98.7 F (37.1 C)   Resp 18   Ht 5' (1.524 m)   Wt 119 lb (54 kg)   LMP 08/20/2017   SpO2 98%   BMI 23.24 kg/m  Body mass index is 23.24 kg/m. Gen: Afebrile. No acute distress. Nontoxic in appearance, well developed, well nourished.  Very pleasant female. HENT: AT. [place]. MMM, no oral lesions.  Eyes:Pupils Equal Round Reactive to light, Extraocular movements intact,  Conjunctiva without redness, discharge or icterus. CV: RRR  Chest: CTAB, no wheeze or crackles.  Abd: Soft.  Flat. ND.  Mild tenderness to palpation deep right lower quadrant.  BS positive.  No masses palpated. No rebound or guarding. Neg McBurney point.  Negative Murphy sign.  Negative psoas sign. MSK: No CVA tenderness Skin: No rashes, purpura or  petechiae.  Neuro:  Normal gait. PERLA. EOMi. Alert. Oriented x3  No exam data present No results found. Results for orders placed or performed in visit on 09/08/17 (from the past 24 hour(s))  POCT Urinalysis Dipstick (Automated)     Status: Abnormal   Collection Time: 09/08/17  3:57 PM  Result Value Ref Range   Color, UA Yellow    Clarity, UA clear    Glucose, UA Negative    Bilirubin, UA Negative    Ketones, UA Negative    Spec Grav, UA 1.010 1.010 - 1.025   Blood, UA 1+    pH, UA 7.5 5.0 - 8.0   Protein, UA Negative    Urobilinogen, UA 0.2 0.2 or 1.0 E.U./dL   Nitrite, UA Negative    Leukocytes, UA Small (1+) (A) Negative    Assessment/Plan: Dale Strausser is a 37 y.o.  female present for OV for  Hematuria, unspecified type/abdominal pain -Vital signs stable.  Tolerating p.o.  Only mild tenderness on exam.  Suspect kidney stone causing discomfort, that may have passed since pain and gross hematuria have resolved or at least improved.  Discussed options with her today.  Will send for urine culture, start antibiotics and NSAIDs scheduled. - POCT Urinalysis Dipstick (Automated): Positive leukocytes and blood. - Urine Culture sent - Macrobid twice daily x7 days prescribed. -Discussed with her if pain worsens, infectious signs such as fever, nausea or vomit occur she is to be seen urgently for condition.   Reviewed expectations re: course of current medical issues.  Discussed self-management of symptoms.  Outlined signs and symptoms indicating need for more acute intervention.  Patient verbalized understanding and all questions were answered.  Patient received an After-Visit Summary.    Orders Placed This Encounter  Procedures  . POCT Urinalysis Dipstick (Automated)     Note is dictated utilizing voice recognition software. Although note has been proof read prior to signing, occasional typographical errors still can be missed. If any questions arise, please do not hesitate to call for verification.   electronically signed by:  Felix Pacini, DO  [facility] - [place]

## 2017-09-08 NOTE — Patient Instructions (Signed)
This is very possibly a kidney stone or urinary tract infection. I am treating you with Macrobid every 12 hours for 7 days. This is an antibiotic.  I am also prescribing naproxen every 12 hours with food for 3-7 days. This helps a stone to pass if it is a small kidney stone.   We will call you once urine culture is returned to provide further instruction if necessary. I do not feel a need to get other labs or imaging today since there Is mild improvement. However, if worsening please be seen in ED if emergent or in the office if not emergent.    Kidney Stones Kidney stones (urolithiasis) are rock-like masses that form inside of the kidneys. Kidneys are organs that make pee (urine). A kidney stone can cause very bad pain and can block the flow of pee. The stone usually leaves your body (passes) through your pee. You may need to have a doctor take out the stone. Follow these instructions at home: Eating and drinking  Drink enough fluid to keep your pee clear or pale yellow. This will help you pass the stone.  If told by your doctor, change the foods you eat (your diet). This may include: ? Limiting how much salt (sodium) you eat. ? Eating more fruits and vegetables. ? Limiting how much meat, poultry, fish, and eggs you eat.  Follow instructions from your doctor about eating or drinking restrictions. General instructions  Collect pee samples as told by your doctor. You may need to collect a pee sample: ? 24 hours after a stone comes out. ? 8-12 weeks after a stone comes out, and every 6-12 months after that.  Strain your pee every time you pee (urinate), for as long as told. Use the strainer that your doctor recommends.  Do not throw out the stone. Keep it so that it can be tested by your doctor.  Take over-the-counter and prescription medicines only as told by your doctor.  Keep all follow-up visits as told by your doctor. This is important. You may need follow-up tests. Preventing  kidney stones To prevent another kidney stone:  Drink enough fluid to keep your pee clear or pale yellow. This is the best way to prevent kidney stones.  Eat healthy foods.  Avoid certain foods as told by your doctor. You may be told to eat less protein.  Stay at a healthy weight.  Contact a doctor if:  You have pain that gets worse or does not get better with medicine. Get help right away if:  You have a fever or chills.  You get very bad pain.  You get new pain in your belly (abdomen).  You pass out (faint).  You cannot pee. This information is not intended to replace advice given to you by your health care provider. Make sure you discuss any questions you have with your health care provider. Document Released: 09/30/2007 Document Revised: 12/31/2015 Document Reviewed: 12/31/2015 Elsevier Interactive Patient Education  2017 [employer].

## 2017-09-09 ENCOUNTER — Encounter: Payer: Self-pay | Admitting: Family Medicine

## 2017-09-09 LAB — URINE CULTURE
MICRO NUMBER:: 90594018
SPECIMEN QUALITY: ADEQUATE

## 2017-09-10 ENCOUNTER — Telehealth: Payer: Self-pay | Admitting: Family Medicine

## 2017-09-10 NOTE — Telephone Encounter (Signed)
Left detailed message with results and instructions on patient voice mail per DPR 

## 2017-09-10 NOTE — Telephone Encounter (Signed)
Please inform patient the following information: Her urine is negative for bacteria infection. She can continue the abx prescribed until completed if she is seeing improvement in her symptoms on medication.   I suspect this may have been a kidney stone causing her symptoms. If so, many stones pass on their own and may already have since her urine had less blood in it by the time of her appt.   However, if abd pain worsens, fever, chills, nausea or vomit occur, she should be see immediately to rule out other causes (ovarian/appendix)  or retained stone.

## 2017-10-04 ENCOUNTER — Ambulatory Visit (INDEPENDENT_AMBULATORY_CARE_PROVIDER_SITE_OTHER): Payer: 59 | Admitting: Family Medicine

## 2017-10-04 ENCOUNTER — Encounter: Payer: Self-pay | Admitting: Family Medicine

## 2017-10-04 VITALS — BP 116/75 | HR 55 | Temp 98.4°F | Resp 16 | Ht 61.0 in | Wt 119.0 lb

## 2017-10-04 DIAGNOSIS — E559 Vitamin D deficiency, unspecified: Secondary | ICD-10-CM

## 2017-10-04 DIAGNOSIS — Z0000 Encounter for general adult medical examination without abnormal findings: Secondary | ICD-10-CM

## 2017-10-04 DIAGNOSIS — Z114 Encounter for screening for human immunodeficiency virus [HIV]: Secondary | ICD-10-CM | POA: Diagnosis not present

## 2017-10-04 DIAGNOSIS — Z01419 Encounter for gynecological examination (general) (routine) without abnormal findings: Secondary | ICD-10-CM | POA: Diagnosis not present

## 2017-10-04 DIAGNOSIS — Z6822 Body mass index (BMI) 22.0-22.9, adult: Secondary | ICD-10-CM | POA: Diagnosis not present

## 2017-10-04 LAB — CBC WITH DIFFERENTIAL/PLATELET
BASOS PCT: 2.5 % (ref 0.0–3.0)
Basophils Absolute: 0.1 10*3/uL (ref 0.0–0.1)
EOS PCT: 10.6 % — AB (ref 0.0–5.0)
Eosinophils Absolute: 0.5 10*3/uL (ref 0.0–0.7)
HEMATOCRIT: 37.5 % (ref 36.0–46.0)
HEMOGLOBIN: 12.3 g/dL (ref 12.0–15.0)
LYMPHS PCT: 31.3 % (ref 12.0–46.0)
Lymphs Abs: 1.6 10*3/uL (ref 0.7–4.0)
MCHC: 32.8 g/dL (ref 30.0–36.0)
MCV: 81.1 fl (ref 78.0–100.0)
Monocytes Absolute: 0.4 10*3/uL (ref 0.1–1.0)
Monocytes Relative: 8.5 % (ref 3.0–12.0)
Neutro Abs: 2.4 10*3/uL (ref 1.4–7.7)
Neutrophils Relative %: 47.1 % (ref 43.0–77.0)
Platelets: 372 10*3/uL (ref 150.0–400.0)
RBC: 4.62 Mil/uL (ref 3.87–5.11)
RDW: 13.3 % (ref 11.5–15.5)
WBC: 5 10*3/uL (ref 4.0–10.5)

## 2017-10-04 LAB — LIPID PANEL
CHOL/HDL RATIO: 3
Cholesterol: 195 mg/dL (ref 0–200)
HDL: 64.8 mg/dL (ref >39.00)
LDL Cholesterol: 121 mg/dL — ABNORMAL HIGH (ref 0–99)
NonHDL: 129.96
TRIGLYCERIDES: 47 mg/dL (ref 0.0–149.0)
VLDL: 9.4 mg/dL (ref 0.0–40.0)

## 2017-10-04 LAB — COMPREHENSIVE METABOLIC PANEL
ALK PHOS: 36 U/L — AB (ref 39–117)
ALT: 11 U/L (ref 0–35)
AST: 14 U/L (ref 0–37)
Albumin: 4.1 g/dL (ref 3.5–5.2)
BILIRUBIN TOTAL: 1 mg/dL (ref 0.2–1.2)
BUN: 12 mg/dL (ref 6–23)
CALCIUM: 9.5 mg/dL (ref 8.4–10.5)
CO2: 29 meq/L (ref 19–32)
Chloride: 105 mEq/L (ref 96–112)
Creatinine, Ser: 0.78 mg/dL (ref 0.40–1.20)
GFR: 87.25 mL/min (ref >60.00)
Glucose, Bld: 104 mg/dL — ABNORMAL HIGH (ref 70–99)
Potassium: 4.5 mEq/L (ref 3.5–5.1)
Sodium: 141 mEq/L (ref 135–145)
Total Protein: 6.8 g/dL (ref 6.0–8.3)

## 2017-10-04 LAB — VITAMIN D 25 HYDROXY (VIT D DEFICIENCY, FRACTURES): VITD: 25.32 ng/mL — ABNORMAL LOW (ref 30.00–100.00)

## 2017-10-04 LAB — TSH: TSH: 1.35 u[IU]/mL (ref 0.35–4.50)

## 2017-10-04 NOTE — Patient Instructions (Signed)

## 2017-10-04 NOTE — Progress Notes (Signed)
Office Note 10/04/2017  CC:  Chief Complaint  Patient presents with  . Annual Exam    Pt is fasting.     HPI:  Yolanda Mccoy is a 37 y.o. female who is here accompanied by her husband for annual health maintenance exam. Has GYN f/u with Dr. Baird Kay today.  Exercise: dancing and treadmill regularly. Diet: "healthy" Dental: preventatives UTD. Eyes: exam q1-2 yrs.  Hx of vit D def: last year took high dose replacement and since then takes 2000 U per day.   Past Medical History:  Diagnosis Date  . Abdominal pain, left lateral 04/02/2011  . Eczema 04/02/2011  . GERD (gastroesophageal reflux disease) 04/02/2011  . Gestational diabetes   . Keratosis pilaris 04/02/2011  . Recurrent oral ulcers 04/02/2011    Past Surgical History:  Procedure Laterality Date  . CESAREAN SECTION  2008  . TOOTH EXTRACTION  2009    History reviewed. No pertinent family history.  Social History   Socioeconomic History  . Marital status: Married    Spouse name: Not on file  . Number of children: 1  . Years of education: Not on file  . Highest education level: Not on file  Occupational History  . Occupation: housewife  Social Needs  . Financial resource strain: Not on file  . Food insecurity:    Worry: Not on file    Inability: Not on file  . Transportation needs:    Medical: Not on file    Non-medical: Not on file  Tobacco Use  . Smoking status: Never Smoker  . Smokeless tobacco: Never Used  Substance and Sexual Activity  . Alcohol use: No  . Drug use: No  . Sexual activity: Yes  Lifestyle  . Physical activity:    Days per week: Not on file    Minutes per session: Not on file  . Stress: Not on file  Relationships  . Social connections:    Talks on phone: Not on file    Gets together: Not on file    Attends religious service: Not on file    Active member of club or organization: Not on file    Attends meetings of clubs or organizations: Not on file    Relationship  status: Not on file  . Intimate partner violence:    Fear of current or ex partner: Not on file    Emotionally abused: Not on file    Physically abused: Not on file    Forced sexual activity: Not on file  Other Topics Concern  . Not on file  Social History Narrative  . Not on file    Outpatient Medications Prior to Visit  Medication Sig Dispense Refill  . VITAMIN D, CHOLECALCIFEROL, PO Take 2,000 Units by mouth daily.     . naproxen (NAPROSYN) 500 MG tablet Take 1 tablet (500 mg total) by mouth 2 (two) times daily with a meal. (Patient not taking: Reported on 10/04/2017) 14 tablet 0  . nitrofurantoin, macrocrystal-monohydrate, (MACROBID) 100 MG capsule Take 1 capsule (100 mg total) by mouth 2 (two) times daily. (Patient not taking: Reported on 10/04/2017) 14 capsule 0   No facility-administered medications prior to visit.     Allergies  Allergen Reactions  . Codeine Nausea Only    ROS Review of Systems  Constitutional: Negative for appetite change, chills, fatigue and fever.  HENT: Negative for congestion, dental problem, ear pain and sore throat.   Eyes: Negative for discharge, redness and visual disturbance.  Respiratory: Negative for  cough, chest tightness, shortness of breath and wheezing.   Cardiovascular: Negative for chest pain, palpitations and leg swelling.  Gastrointestinal: Negative for abdominal pain, blood in stool, diarrhea, nausea and vomiting.  Genitourinary: Negative for difficulty urinating, dysuria, flank pain, frequency, hematuria and urgency.  Musculoskeletal: Negative for arthralgias, back pain, joint swelling, myalgias and neck stiffness.  Skin: Negative for pallor and rash.  Neurological: Negative for dizziness, speech difficulty, weakness and headaches.  Hematological: Negative for adenopathy. Does not bruise/bleed easily.  Psychiatric/Behavioral: Negative for confusion and sleep disturbance. The patient is not nervous/anxious.     PE; Blood pressure  116/75, pulse (!) 55, temperature 98.4 F (36.9 C), temperature source Oral, resp. rate 16, height 5\' 1"  (1.549 m), weight 119 lb (54 kg), last menstrual period 09/15/2017, SpO2 99 %. Gen: Alert, well appearing.  Patient is oriented to person, place, time, and situation. AFFECT: pleasant, lucid thought and speech. ENT: Ears: EACs clear, normal epithelium.  TMs with good light reflex and landmarks bilaterally.  Eyes: no injection, icteris, swelling, or exudate.  EOMI, PERRLA. Nose: no drainage or turbinate edema/swelling.  No injection or focal lesion.  Mouth: lips without lesion/swelling.  Oral mucosa pink and moist.  Dentition intact and without obvious caries or gingival swelling.  Oropharynx without erythema, exudate, or swelling.  Neck: supple/nontender.  No LAD, mass, or TM.  Carotid pulses 2+ bilaterally, without bruits. CV: RRR, no m/r/g.   LUNGS: CTA bilat, nonlabored resps, good aeration in all lung fields. ABD: soft, NT, ND, BS normal.  No hepatospenomegaly or mass.  No bruits. EXT: no clubbing, cyanosis, or edema.  Musculoskeletal: no joint swelling, erythema, warmth, or tenderness.  ROM of all joints intact. Skin - no sores or suspicious lesions or rashes or color changes   Pertinent labs:  Lab Results  Component Value Date   TSH 2.44 04/06/2011   Lab Results  Component Value Date   WBC 6.0 04/06/2011   HGB 12.6 04/06/2011   HCT 37.5 04/06/2011   MCV 80.4 04/06/2011   PLT 335.0 04/06/2011   Lab Results  Component Value Date   CREATININE 0.8 04/06/2011   BUN 14 04/06/2011   NA 139 04/06/2011   K 4.0 04/06/2011   CL 103 04/06/2011   CO2 25 04/06/2011   Lab Results  Component Value Date   ALT 12 04/06/2011   AST 17 04/06/2011   ALKPHOS 39 04/06/2011   BILITOT 1.1 04/06/2011   Lab Results  Component Value Date   CHOL 183 04/06/2011   Lab Results  Component Value Date   HDL 70.90 04/06/2011   Lab Results  Component Value Date   LDLCALC 98 04/06/2011    Lab Results  Component Value Date   TRIG 72.0 04/06/2011   Lab Results  Component Value Date   CHOLHDL 3 04/06/2011     ASSESSMENT AND PLAN:   Health maintenance exam: Reviewed age and gender appropriate health maintenance issues (prudent diet, regular exercise, health risks of tobacco and excessive alcohol, use of seatbelts, fire alarms in home, use of sunscreen).  Also reviewed age and gender appropriate health screening as well as vaccine recommendations. Vaccines: UTD. Labs: fasting HP, HIV screen, and vit D recheck today. Cervical ca screening: per GYN later today. Breast ca screening: per GYN later today. Colon ca screening: average risk patient= start screening at age 39 yrs.  An After Visit Summary was printed and given to the patient.  FOLLOW UP:  Return in about 1 year (around 10/05/2018)  for annual CPE (fasting).  Signed:  Santiago Bumpers, MD           10/04/2017

## 2017-10-05 ENCOUNTER — Encounter: Payer: Self-pay | Admitting: Family Medicine

## 2017-10-05 LAB — HIV ANTIBODY (ROUTINE TESTING W REFLEX): HIV: NONREACTIVE

## 2017-12-14 ENCOUNTER — Telehealth: Payer: Self-pay | Admitting: *Deleted

## 2017-12-14 NOTE — Telephone Encounter (Signed)
SW pts husband, okay per DPR.   He stated that pt bought 10,000units Vit D instead of 2,000units. Would it be okay if she just took one 10,000unit Vit D a week?   Please advise. Thanks.

## 2017-12-14 NOTE — Telephone Encounter (Signed)
Copied from CRM [phone]. Topic: General - Other >> Dec 14, 2017  1:33 PM Oneal GroutSebastian, Jennifer S wrote: Reason for CRM: Requesting VIT D lab result

## 2017-12-15 ENCOUNTER — Encounter: Payer: Self-pay | Admitting: Family Medicine

## 2017-12-15 NOTE — Telephone Encounter (Signed)
Needs to take one of the 10,000 U vit D tabs TWICE per week.-thx

## 2017-12-15 NOTE — Telephone Encounter (Signed)
Pts husband advised and voiced understanding, okay per DPR.  

## 2018-01-17 ENCOUNTER — Ambulatory Visit: Payer: 59 | Admitting: Family Medicine

## 2018-01-17 ENCOUNTER — Encounter: Payer: Self-pay | Admitting: Family Medicine

## 2018-01-17 VITALS — BP 127/77 | HR 64 | Temp 97.7°F | Resp 16 | Ht 61.0 in | Wt 122.1 lb

## 2018-01-17 DIAGNOSIS — T783XXA Angioneurotic edema, initial encounter: Secondary | ICD-10-CM

## 2018-01-17 DIAGNOSIS — L508 Other urticaria: Secondary | ICD-10-CM

## 2018-01-17 DIAGNOSIS — Z23 Encounter for immunization: Secondary | ICD-10-CM | POA: Diagnosis not present

## 2018-01-17 MED ORDER — FEXOFENADINE HCL 180 MG PO TABS: 180.0000 mg | ORAL_TABLET | Freq: Every day | ORAL | 3 refills | Status: DC

## 2018-01-17 MED ORDER — EPINEPHRINE 0.3 MG/0.3ML IJ SOAJ: 0.3000 mg | Freq: Once | INTRAMUSCULAR | 1 refills | Status: AC

## 2018-01-17 NOTE — Progress Notes (Signed)
OFFICE VISIT  01/17/2018   CC:  Chief Complaint  Patient presents with  . Rash    itching   HPI:    Patient is a 37 y.o.  female who presents for 2 nights ago started itching--started on hands and spread to rest of body, noted onset of hive-like rash all over body and swelling of lips, face, eyes.  No feeling of throat closing, no SOB, no wheezing.  Tongue was not swollen.  Took allegra, then took a shower and 25 min later things were less itchy and rashed stopped spreading.  Then started benadryl, rash abating slowly, itching is less. No new contact irritants or allergens identified.  No preceding/recent viral illness or fever. This did happen once about 1 yr ago but only involved area around neck and much milder facial swelling.  She feels well today, just a bit of residual itching.  ROS: no CP, no SOB, no wheezing, no cough, no dizziness, no HAs, no rashes, no melena/hematochezia.  No polyuria or polydipsia.  No myalgias or arthralgias.  No joint swelling.    Past Medical History:  Diagnosis Date  . Abdominal pain, left lateral 04/02/2011  . Eczema 04/02/2011  . GERD (gastroesophageal reflux disease) 04/02/2011  . Gestational diabetes   . Keratosis pilaris 04/02/2011  . Recurrent oral ulcers 04/02/2011  . Vitamin D deficiency    High dose replacement 2018, then 2000 U qd-->vit D recheck still <30, so increased to TWO 2000 U tabs qd 09/2017. Then 2 of the 10K U tabs q week as of 12/15/17.    Past Surgical History:  Procedure Laterality Date  . CESAREAN SECTION  2008  . TOOTH EXTRACTION  2009    Outpatient Medications Prior to Visit  Medication Sig Dispense Refill  . VITAMIN D, CHOLECALCIFEROL, PO Take 2,000 Units by mouth daily.      No facility-administered medications prior to visit.     Allergies  Allergen Reactions  . Codeine Nausea Only    ROS As per HPI  PE: Blood pressure 127/77, pulse 64, temperature 97.7 F (36.5 C), temperature source Oral, resp. rate 16,  height 5\' 1"  (1.549 m), weight 122 lb 2 oz (55.4 kg), last menstrual period 01/11/2018, SpO2 98 %. Gen: Alert, well appearing.  Patient is oriented to person, place, time, and situation. AFFECT: pleasant, lucid thought and speech. ENT: Ears: EACs clear, normal epithelium.  TMs with good light reflex and landmarks bilaterally.  Eyes: no injection, icteris, swelling, or exudate.  EOMI, PERRLA. Nose: no drainage or turbinate edema/swelling.  No injection or focal lesion.  Mouth: lips without lesion/swelling.  Oral mucosa pink and moist.  Dentition intact and without obvious caries or gingival swelling.  Oropharynx without erythema, exudate, or swelling.  No facial swelling or lips or tongue swelling.  Eyes w/out swelling. Neck - No masses or thyromegaly or limitation in range of motion CV: RRR, no m/r/g.   LUNGS: CTA bilat, nonlabored resps, good aeration in all lung fields. SKIN: she has a VERY faint/subtle reticular, pinkish macular rash on volar aspect of both arms and on her lower abdomen.  LABS:    Chemistry      Component Value Date/Time   NA 141 10/04/2017 0853   K 4.5 10/04/2017 0853   CL 105 10/04/2017 0853   CO2 29 10/04/2017 0853   BUN 12 10/04/2017 0853   CREATININE 0.78 10/04/2017 0853      Component Value Date/Time   CALCIUM 9.5 10/04/2017 0853   ALKPHOS  36 (L) 10/04/2017 0853   AST 14 10/04/2017 0853   ALT 11 10/04/2017 0853   BILITOT 1.0 10/04/2017 0853      IMPRESSION AND PLAN:  Urticaria and angioedema: suspect idiopathic. Plan: start daily allegra 180mg . Epi pen discussed in detail, rx sent to pharmacy. Referral to allergist.  An After Visit Summary was printed and given to the patient.  FOLLOW UP: Return for as needed.  Signed:  Santiago BumpersPhil Hunter Bachar, MD           01/17/2018

## 2018-02-08 ENCOUNTER — Ambulatory Visit (INDEPENDENT_AMBULATORY_CARE_PROVIDER_SITE_OTHER): Payer: 59 | Admitting: Allergy and Immunology

## 2018-02-08 ENCOUNTER — Encounter: Payer: Self-pay | Admitting: Allergy and Immunology

## 2018-02-08 VITALS — BP 122/68 | HR 66 | Temp 98.1°F | Resp 16 | Wt 121.8 lb

## 2018-02-08 DIAGNOSIS — T7840XD Allergy, unspecified, subsequent encounter: Secondary | ICD-10-CM | POA: Diagnosis not present

## 2018-02-08 DIAGNOSIS — J3089 Other allergic rhinitis: Secondary | ICD-10-CM | POA: Diagnosis not present

## 2018-02-08 DIAGNOSIS — H1010 Acute atopic conjunctivitis, unspecified eye: Secondary | ICD-10-CM | POA: Insufficient documentation

## 2018-02-08 DIAGNOSIS — L500 Allergic urticaria: Secondary | ICD-10-CM | POA: Diagnosis not present

## 2018-02-08 DIAGNOSIS — H1013 Acute atopic conjunctivitis, bilateral: Secondary | ICD-10-CM

## 2018-02-08 DIAGNOSIS — T7840XA Allergy, unspecified, initial encounter: Secondary | ICD-10-CM | POA: Insufficient documentation

## 2018-02-08 HISTORY — DX: Allergic urticaria: L50.0

## 2018-02-08 MED ORDER — AZELASTINE HCL 0.1 % NA SOLN: 1.0000 | Freq: Two times a day (BID) | NASAL | 5 refills | Status: DC

## 2018-02-08 MED ORDER — LEVOCETIRIZINE DIHYDROCHLORIDE 5 MG PO TABS: 5.0000 mg | ORAL_TABLET | Freq: Every evening | ORAL | 5 refills | Status: DC

## 2018-02-08 MED ORDER — OLOPATADINE HCL 0.2 % OP SOLN: 1.0000 [drp] | Freq: Every day | OPHTHALMIC | 5 refills | Status: DC | PRN

## 2018-02-08 NOTE — Assessment & Plan Note (Signed)
   Treatment plan as outlined above for allergic rhinitis.  A prescription has been provided for Pataday, one drop per eye daily as needed.  I have also recommended eye lubricant drops (i.e., Natural Tears) as needed. 

## 2018-02-08 NOTE — Progress Notes (Signed)
  New Patient Note  RE: Yolanda Mccoy MRN: [id number] DOB: 04/16/1981 Date of Office Visit: 02/08/2018  Referring provider: McGowen, Philip H, MD Primary care provider: McGowen, Philip H, MD  Chief Complaint: Allergic Reaction and Allergic Rhinitis    History of present illness: Yolanda Mccoy is a 37 y.o. female seen today in consultation requested by Philip McGowen, MD.  She is accompanied today by her husband who assists with the history.  Approximately 4 weeks ago, she woke up at midnight with generalized urticaria, generalized pruritus, and mild facial swelling.  She did not experience cardiopulmonary or GI symptoms.  She took fexofenadine in the shower and her symptoms resolved over the next hour or so without further intervention.  At 9 PM she had consumed shrimp with a head on it, vegetables, rice, and spicy sauce.  She eats all these foods on a regular basis without a problem and, in fact, has consumed each of these foods in the interval since the reaction without a problem.  However, she states that the night of the reaction the strength that she consumed included the heads were as she typically only eats shrimp tails.  Last summer she developed generalized urticaria/pruritus on one occasion which resolved with diphenhydramine.  She did not experience cardiopulmonary or GI symptoms.  No specific medication, food, skin care product, detergent, soap, or other environmental triggers have been identified.  Arsenia experiences nasal congestion, rhinorrhea, sneezing, postnasal drainage, nasal pruritus, and ocular pruritus.  These symptoms are most frequent and severe during the springtime and in the fall.  She is able to adequately control these symptoms with fexofenadine and fluticasone nasal spray.  Assessment and plan: Allergic reaction The patients history suggests allergic reaction with an unclear trigger. Food allergen skin tests were negative today despite a positive  histamine control with the exception of equivocal/borderline positive results to almond and hazelnut. However, she reports that she consumes tree nuts on a regular basis without adverse symptoms, therefore these represent false positive results.  We will proceed with in vitro lab studies to help establish an etiology.  The following labs have been ordered: FCeRI antibody, anti-thyroglobulin antibody, thyroid peroxidase antibody, baseline serum tryptase, CBC, CMP, ESR, ANA, and serum specific IgE against shellfish panel, and alpha gal panel.   Should symptoms recur, a journal is to be kept recording any foods eaten, beverages consumed, medications taken within a 6 hour period prior to the onset of symptoms, as well as activities performed, and environmental conditions. For any symptoms concerning for anaphylaxis, epinephrine is to be administered and 911 is to be called immediately.  Seasonal and perennial allergic rhinitis  Aeroallergen avoidance measures have been discussed and provided in written form.  A prescription has been provided for levocetirizine, 5 mg daily as needed.  A prescription has been provided for azelastine nasal spray, 1-2 sprays per nostril 2 times daily as needed. Proper nasal spray technique has been discussed and demonstrated.   Nasal saline spray (i.e., Simply Saline) or nasal saline lavage (i.e., NeilMed) is recommended as needed and prior to medicated nasal sprays.  If allergen avoidance measures and medications fail to adequately relieve symptoms, aeroallergen immunotherapy will be considered.  Allergic conjunctivitis  Treatment plan as outlined above for allergic rhinitis.  A prescription has been provided for Pataday, one drop per eye daily as needed.  I have also recommended eye lubricant drops (i.e., Natural Tears) as needed.   Meds ordered this encounter  Medications  . levocetirizine (XYZAL) 5 MG   tablet    Sig: Take 1 tablet (5 mg total) by mouth every  evening.    Dispense:  30 tablet    Refill:  5  . azelastine (ASTELIN) 0.1 % nasal spray    Sig: Place 1-2 sprays into both nostrils 2 (two) times daily.    Dispense:  30 mL    Refill:  5  . Olopatadine HCl (PATADAY) 0.2 % SOLN    Sig: Place 1 drop into both eyes daily as needed.    Dispense:  1 Bottle    Refill:  5    Diagnostics: Environmental skin testing: Positive to ragweed pollen, weed pollen, tree pollen, and dust mite antigen. Food allergen skin testing: Borderline positive to almond and hazelnut, however she reports that she consumes tree nuts on a regular basis without adverse symptoms, therefore these represent false positive results.    Physical examination: Blood pressure 122/68, pulse 66, temperature 98.1 F (36.7 C), temperature source Oral, resp. rate 16, weight 121 lb 12.8 oz (55.2 kg), last menstrual period 01/11/2018, SpO2 98 %.  General: Alert, interactive, in no acute distress. HEENT: TMs pearly gray, turbinates moderately edematous with clear discharge, post-pharynx moderately erythematous. Neck: Supple without lymphadenopathy. Lungs: Clear to auscultation without wheezing, rhonchi or rales. CV: Normal S1, S2 without murmurs. Abdomen: Nondistended, nontender. Skin: Warm and dry, without lesions or rashes. Extremities:  No clubbing, cyanosis or edema. Neuro:   Grossly intact.  Review of systems:  Review of systems negative except as noted in HPI / PMHx or noted below: Review of Systems  Constitutional: Negative.   HENT: Negative.   Eyes: Negative.   Respiratory: Negative.   Cardiovascular: Negative.   Gastrointestinal: Negative.   Genitourinary: Negative.   Musculoskeletal: Negative.   Skin: Negative.   Neurological: Negative.   Endo/Heme/Allergies: Negative.   Psychiatric/Behavioral: Negative.     Past medical history:  Past Medical History:  Diagnosis Date  . Abdominal pain, left lateral 04/02/2011  . Allergic urticaria 02/08/2018  . Eczema  04/02/2011  . GERD (gastroesophageal reflux disease) 04/02/2011  . Gestational diabetes   . Keratosis pilaris 04/02/2011  . Recurrent oral ulcers 04/02/2011  . Vitamin D deficiency    High dose replacement 2018, then 2000 U qd-->vit D recheck still <30, so increased to TWO 2000 U tabs qd 09/2017. Then 2 of the 10K U tabs q week as of 12/15/17.    Past surgical history:  Past Surgical History:  Procedure Laterality Date  . CESAREAN SECTION  2008  . TOOTH EXTRACTION  2009    Family history: Family History  Problem Relation Age of Onset  . Allergic rhinitis Neg Hx   . Angioedema Neg Hx   . Asthma Neg Hx   . Eczema Neg Hx   . Immunodeficiency Neg Hx   . Urticaria Neg Hx     Social history: Social History   Socioeconomic History  . Marital status: Married    Spouse name: Not on file  . Number of children: 1  . Years of education: Not on file  . Highest education level: Not on file  Occupational History  . Occupation: housewife  Social Needs  . Financial resource strain: Not on file  . Food insecurity:    Worry: Not on file    Inability: Not on file  . Transportation needs:    Medical: Not on file    Non-medical: Not on file  Tobacco Use  . Smoking status: Never Smoker  . Smokeless tobacco: Never   Used  Substance and Sexual Activity  . Alcohol use: No  . Drug use: No  . Sexual activity: Yes  Lifestyle  . Physical activity:    Days per week: Not on file    Minutes per session: Not on file  . Stress: Not on file  Relationships  . Social connections:    Talks on phone: Not on file    Gets together: Not on file    Attends religious service: Not on file    Active member of club or organization: Not on file    Attends meetings of clubs or organizations: Not on file    Relationship status: Not on file  . Intimate partner violence:    Fear of current or ex partner: Not on file    Emotionally abused: Not on file    Physically abused: Not on file    Forced sexual  activity: Not on file  Other Topics Concern  . Not on file  Social History Narrative  . Not on file   Environmental History: The patient lives in a 50-year-old house with hardwood floors throughout and central air/heat.  There is no known mold/water damage in the home.  There is a dog in the house which does not have access to her bedroom.  She is a non-smoker.  Allergies as of 02/08/2018      Reactions   Codeine Nausea Only      Medication List        Accurate as of 02/08/18 11:59 PM. Always use your most recent med list.          azelastine 0.1 % nasal spray Commonly known as:  ASTELIN Place 1-2 sprays into both nostrils 2 (two) times daily.   EPINEPHrine 0.3 mg/0.3 mL Soaj injection Commonly known as:  EPI-PEN INJECT 0.3 MLS (0.3 MG TOTAL) INTO THE MUSCLE ONCE FOR 1 DOSE.   fexofenadine 180 MG tablet Commonly known as:  ALLEGRA Take 1 tablet (180 mg total) by mouth daily.   levocetirizine 5 MG tablet Commonly known as:  XYZAL Take 1 tablet (5 mg total) by mouth every evening.   Olopatadine HCl 0.2 % Soln Place 1 drop into both eyes daily as needed.   VITAMIN D (CHOLECALCIFEROL) PO Take 10,000 Units by mouth 2 (two) times a week.       Known medication allergies: Allergies  Allergen Reactions  . Codeine Nausea Only    I appreciate the opportunity to take part in Alanna's care. Please do not hesitate to contact me with questions.  Sincerely,   R. Carter Bobbitt, MD 

## 2018-02-08 NOTE — Assessment & Plan Note (Addendum)
The patients history suggests allergic reaction with an unclear trigger. Food allergen skin tests were negative today despite a positive histamine control with the exception of equivocal/borderline positive results to almond and hazelnut. However, she reports that she consumes tree nuts on a regular basis without adverse symptoms, therefore these represent false positive results.  We will proceed with in vitro lab studies to help establish an etiology.  The following labs have been ordered: FCeRI antibody, anti-thyroglobulin antibody, thyroid peroxidase antibody, baseline serum tryptase, CBC, CMP, ESR, ANA, and serum specific IgE against shellfish panel, and alpha gal panel.   Should symptoms recur, a journal is to be kept recording any foods eaten, beverages consumed, medications taken within a 6 hour period prior to the onset of symptoms, as well as activities performed, and environmental conditions. For any symptoms concerning for anaphylaxis, epinephrine is to be administered and 911 is to be called immediately.

## 2018-02-08 NOTE — Patient Instructions (Addendum)
Allergic reaction The patients history suggests allergic reaction with an unclear trigger. Food allergen skin tests were negative today despite a positive histamine control with the exception of equivocal/borderline positive results to almond and hazelnut. However, she reports that she consumes tree nuts on a regular basis without adverse symptoms, therefore these represent false positive results.  We will proceed with in vitro lab studies to help establish an etiology.  The following labs have been ordered: FCeRI antibody, anti-thyroglobulin antibody, thyroid peroxidase antibody, baseline serum tryptase, CBC, CMP, ESR, ANA, and serum specific IgE against shellfish panel, and alpha gal panel.   Should symptoms recur, a journal is to be kept recording any foods eaten, beverages consumed, medications taken within a 6 hour period prior to the onset of symptoms, as well as activities performed, and environmental conditions. For any symptoms concerning for anaphylaxis, epinephrine is to be administered and 911 is to be called immediately.  Seasonal and perennial allergic rhinitis  Aeroallergen avoidance measures have been discussed and provided in written form.  A prescription has been provided for levocetirizine, 5 mg daily as needed.  A prescription has been provided for azelastine nasal spray, 1-2 sprays per nostril 2 times daily as needed. Proper nasal spray technique has been discussed and demonstrated.   Nasal saline spray (i.e., Simply Saline) or nasal saline lavage (i.e., NeilMed) is recommended as needed and prior to medicated nasal sprays.  If allergen avoidance measures and medications fail to adequately relieve symptoms, aeroallergen immunotherapy will be considered.  Allergic conjunctivitis  Treatment plan as outlined above for allergic rhinitis.  A prescription has been provided for Pataday, one drop per eye daily as needed.  I have also recommended eye lubricant drops (i.e.,  Natural Tears) as needed.   When lab results have returned the patient will be called with further recommendations and follow up instructions.  Control of House Dust Mite Allergen  House dust mites play a major role in allergic asthma and rhinitis.  They occur in environments with high humidity wherever human skin, the food for dust mites is found. High levels have been detected in dust obtained from mattresses, pillows, carpets, upholstered furniture, bed covers, clothes and soft toys.  The principal allergen of the house dust mite is found in its feces.  A gram of dust may contain 1,000 mites and 250,000 fecal particles.  Mite antigen is easily measured in the air during house cleaning activities.    1. Encase mattresses, including the box spring, and pillow, in an air tight cover.  Seal the zipper end of the encased mattresses with wide adhesive tape. 2. Wash the bedding in water of 130 degrees Farenheit weekly.  Avoid cotton comforters/quilts and flannel bedding: the most ideal bed covering is the dacron comforter. 3. Remove all upholstered furniture from the bedroom. 4. Remove carpets, carpet padding, rugs, and non-washable window drapes from the bedroom.  Wash drapes weekly or use plastic window coverings. 5. Remove all non-washable stuffed toys from the bedroom.  Wash stuffed toys weekly. 6. Have the room cleaned frequently with a vacuum cleaner and a damp dust-mop.  The patient should not be in a room which is being cleaned and should wait 1 hour after cleaning before going into the room. 7. Close and seal all heating outlets in the bedroom.  Otherwise, the room will become filled with dust-laden air.  An electric heater can be used to heat the room. 8. Reduce indoor humidity to less than 50%.  Do not use a  humidifier.  Reducing Pollen Exposure  The American Academy of Allergy, Asthma and Immunology suggests the following steps to reduce your exposure to pollen during allergy seasons.     1. Do not hang sheets or clothing out to dry; pollen may collect on these items. 2. Do not mow lawns or spend time around freshly cut grass; mowing stirs up pollen. 3. Keep windows closed at night.  Keep car windows closed while driving. 4. Minimize morning activities outdoors, a time when pollen counts are usually at their highest. 5. Stay indoors as much as possible when pollen counts or humidity is high and on windy days when pollen tends to remain in the air longer. 6. Use air conditioning when possible.  Many air conditioners have filters that trap the pollen spores. 7. Use a HEPA room air filter to remove pollen form the indoor air you breathe.

## 2018-02-08 NOTE — Assessment & Plan Note (Signed)
   Aeroallergen avoidance measures have been discussed and provided in written form.  A prescription has been provided for levocetirizine, 5 mg daily as needed.  A prescription has been provided for azelastine nasal spray, 1-2 sprays per nostril 2 times daily as needed. Proper nasal spray technique has been discussed and demonstrated.   Nasal saline spray (i.e., Simply Saline) or nasal saline lavage (i.e., NeilMed) is recommended as needed and prior to medicated nasal sprays.  If allergen avoidance measures and medications fail to adequately relieve symptoms, aeroallergen immunotherapy will be considered. 

## 2018-02-09 ENCOUNTER — Encounter: Payer: Self-pay | Admitting: Allergy and Immunology

## 2018-02-13 ENCOUNTER — Encounter: Payer: Self-pay | Admitting: Family Medicine

## 2018-03-04 DIAGNOSIS — H16223 Keratoconjunctivitis sicca, not specified as Sjogren's, bilateral: Secondary | ICD-10-CM | POA: Diagnosis not present

## 2018-04-25 DIAGNOSIS — J019 Acute sinusitis, unspecified: Secondary | ICD-10-CM | POA: Diagnosis not present

## 2018-04-25 DIAGNOSIS — H9201 Otalgia, right ear: Secondary | ICD-10-CM | POA: Diagnosis not present

## 2018-05-09 ENCOUNTER — Other Ambulatory Visit: Payer: Self-pay | Admitting: Allergy and Immunology

## 2018-05-09 DIAGNOSIS — T7840XD Allergy, unspecified, subsequent encounter: Secondary | ICD-10-CM | POA: Diagnosis not present

## 2018-05-09 DIAGNOSIS — L500 Allergic urticaria: Secondary | ICD-10-CM | POA: Diagnosis not present

## 2018-05-17 LAB — CBC WITH DIFFERENTIAL/PLATELET
Basophils Absolute: 0.1 10*3/uL (ref 0.0–0.2)
Basos: 1 %
EOS (ABSOLUTE): 0.2 10*3/uL (ref 0.0–0.4)
Eos: 3 %
Hematocrit: 38.1 % (ref 34.0–46.6)
Hemoglobin: 12.5 g/dL (ref 11.1–15.9)
Immature Grans (Abs): 0.1 10*3/uL (ref 0.0–0.1)
Immature Granulocytes: 1 %
Lymphocytes Absolute: 1.8 10*3/uL (ref 0.7–3.1)
Lymphs: 23 %
MCH: 27.1 pg (ref 26.6–33.0)
MCHC: 32.8 g/dL (ref 31.5–35.7)
MCV: 83 fL (ref 79–97)
Monocytes Absolute: 0.6 10*3/uL (ref 0.1–0.9)
Monocytes: 8 %
Neutrophils Absolute: 4.9 10*3/uL (ref 1.4–7.0)
Neutrophils: 64 %
Platelets: 439 10*3/uL (ref 150–450)
RBC: 4.62 x10E6/uL (ref 3.77–5.28)
RDW: 13 % (ref 11.7–15.4)
WBC: 7.7 10*3/uL (ref 3.4–10.8)

## 2018-05-17 LAB — TRYPTASE: Tryptase: 4.3 ug/L (ref 2.2–13.2)

## 2018-05-17 LAB — ALPHA-GAL PANEL
Alpha Gal IgE*: <0.1 kU/L (ref <0.10)
Beef (Bos spp) IgE: <0.1 kU/L (ref <0.35)
Class Interpretation: 0
Class Interpretation: 0
Class Interpretation: 0
Lamb/Mutton (Ovis spp) IgE: <0.1 kU/L (ref <0.35)
Pork (Sus spp) IgE: <0.1 kU/L (ref <0.35)

## 2018-05-17 LAB — COMPREHENSIVE METABOLIC PANEL
ALT: 11 IU/L (ref 0–32)
AST: 11 IU/L (ref 0–40)
Albumin/Globulin Ratio: 1.8 (ref 1.2–2.2)
Albumin: 4.3 g/dL (ref 3.5–5.5)
Alkaline Phosphatase: 40 IU/L (ref 39–117)
BUN/Creatinine Ratio: 9 (ref 9–23)
BUN: 7 mg/dL (ref 6–20)
Bilirubin Total: 0.6 mg/dL (ref 0.0–1.2)
CO2: 23 mmol/L (ref 20–29)
Calcium: 9.7 mg/dL (ref 8.7–10.2)
Chloride: 97 mmol/L (ref 96–106)
Creatinine, Ser: 0.81 mg/dL (ref 0.57–1.00)
GFR calc Af Amer: 106 mL/min/{1.73_m2} (ref >59)
GFR calc non Af Amer: 92 mL/min/{1.73_m2} (ref >59)
Globulin, Total: 2.4 g/dL (ref 1.5–4.5)
Glucose: 90 mg/dL (ref 65–99)
Potassium: 4.4 mmol/L (ref 3.5–5.2)
Sodium: 144 mmol/L (ref 134–144)
Total Protein: 6.7 g/dL (ref 6.0–8.5)

## 2018-05-17 LAB — ANA W/REFLEX IF POSITIVE
Anti JO-1: <0.2 AI (ref 0.0–0.9)
Anti Nuclear Antibody(ANA): POSITIVE — AB
Centromere Ab Screen: <0.2 AI (ref 0.0–0.9)
Chromatin Ab SerPl-aCnc: <0.2 AI (ref 0.0–0.9)
ENA RNP Ab: 4.4 AI — ABNORMAL HIGH (ref 0.0–0.9)
ENA SM Ab Ser-aCnc: <0.2 AI (ref 0.0–0.9)
ENA SSA (RO) Ab: <0.2 AI (ref 0.0–0.9)
ENA SSB (LA) Ab: <0.2 AI (ref 0.0–0.9)
Scleroderma SCL-70: <0.2 AI (ref 0.0–0.9)
dsDNA Ab: 1 IU/mL (ref 0–9)

## 2018-05-17 LAB — THYROID ANTIBODIES
Thyroglobulin Antibody: <1 IU/mL (ref 0.0–0.9)
Thyroperoxidase Ab SerPl-aCnc: 6 IU/mL (ref 0–34)

## 2018-05-17 LAB — ALLERGEN PROFILE, SHELLFISH
Clam IgE: 0.34 kU/L — AB
F023-IgE Crab: 0.55 kU/L — AB
F080-IgE Lobster: 0.38 kU/L — AB
F290-IgE Oyster: <0.1 kU/L
Scallop IgE: 0.15 kU/L — AB
Shrimp IgE: 1.07 kU/L — AB

## 2018-05-17 LAB — CHRONIC URTICARIA: cu index: 6.7 (ref <10)

## 2018-05-17 LAB — SEDIMENTATION RATE: Sed Rate: 27 mm/hr (ref 0–32)

## 2018-05-19 ENCOUNTER — Encounter: Payer: Self-pay | Admitting: Family Medicine

## 2019-02-03 LAB — HM MAMMOGRAPHY

## 2019-02-06 ENCOUNTER — Encounter: Payer: Self-pay | Admitting: Family Medicine

## 2021-04-22 ENCOUNTER — Encounter: Payer: Self-pay | Admitting: Allergy

## 2021-04-22 ENCOUNTER — Ambulatory Visit: Payer: 59 | Admitting: Allergy

## 2021-04-22 ENCOUNTER — Other Ambulatory Visit: Payer: Self-pay

## 2021-04-22 VITALS — BP 110/66 | HR 70 | Temp 98.4°F | Resp 16 | Ht 61.0 in | Wt 117.8 lb

## 2021-04-22 DIAGNOSIS — T781XXD Other adverse food reactions, not elsewhere classified, subsequent encounter: Secondary | ICD-10-CM

## 2021-04-22 DIAGNOSIS — L509 Urticaria, unspecified: Secondary | ICD-10-CM | POA: Diagnosis not present

## 2021-04-22 DIAGNOSIS — R21 Rash and other nonspecific skin eruption: Secondary | ICD-10-CM | POA: Diagnosis not present

## 2021-04-22 DIAGNOSIS — J302 Other seasonal allergic rhinitis: Secondary | ICD-10-CM | POA: Diagnosis not present

## 2021-04-22 DIAGNOSIS — J3089 Other allergic rhinitis: Secondary | ICD-10-CM | POA: Diagnosis not present

## 2021-04-22 DIAGNOSIS — T7840XD Allergy, unspecified, subsequent encounter: Secondary | ICD-10-CM

## 2021-04-22 MED ORDER — FAMOTIDINE 20 MG PO TABS: 20.0000 mg | ORAL_TABLET | Freq: Two times a day (BID) | ORAL | 3 refills | Status: DC

## 2021-04-22 NOTE — Assessment & Plan Note (Deleted)
Developed an itchy, red, raised rash the day after flu shot on her thigh. Improved after systemic steroids but now has skin discoloration in the area. No prior issues with vaccines. Denies any other changes in diet, meds or personal care products. History of rash/hives in the past but not like this. 04/08/2021 bloodwork ESR, CBC diff, CMP, ANA unremarkable.   Not sure what caused the symptoms.   Avoid future vaccines for now.  Recommend flu shot skin testing next fall in our office prior to administration.   Start zyrtec (cetirizine) 37m at night.  Start allegra 1832min the morning.   If symptoms are not controlled or causes drowsiness let usKoreanow.  Start Pepcid (famotidine) 2062mwice a day.   Avoid the following potential triggers: alcohol, tight clothing, NSAIDs, hot showers and getting overheated.  Keep track of symptoms and take pictures.  If no improvement, recommend dermatology evaluation - possibly skin biopsy.   See below for proper skin care.  Get bloodwork to rule out other etiologies.

## 2021-04-22 NOTE — Progress Notes (Signed)
New Patient Note  RE: Yolanda Mccoy MRN: [id number] DOB: [birth year] Date of Office Visit: 04/22/2021  Consult requested by: Teodora Medici, FNP Primary care provider: Teodora Medici, FNP  Chief Complaint: Allergic Reaction [place] lfu shot 1 1/2 months ago and had rash and intense itching was given pred and it helped but soon as she ran out of pred it came back//Peanuts have started causing red itchy areas)  History of Present Illness: I had the pleasure of seeing Yolanda Mccoy for initial evaluation at the [facility] on 04/23/2021. She is a 40 y.o. female, who is referred here by Teodora Medici, FNP for the evaluation of allergic reaction/rash. She is accompanied today by her husband who provided/contributed to the history.   Patient was last seen on 02/08/2018 by Dr. Verlin Fester for allergic reaction and allergic rhinoconjunctivitis.  Rash:  Patient had the flu shot 1.5 month ago and the day after she developed a rash on her right upper thigh area which then spread to different areas of her body.  Describes them as itchy, red, raised and then the rash changed to a brown color. Associated symptoms include: none.  Suspected triggers are unknown - concerned about the flu shot. Denies any chills, changes in medications, foods, personal care products or recent infections. She had some fevers after the flu shot though. She has tried the following therapies: steroids with good benefit but the rash returns once course is completed. Currently on zyrtec 56m daily.  Previous work up includes: 2020 bloodwork unremarkable by Dr. BVerlin Fester 04/08/2021 ESR, CBC diff, CMP, ANA unremarkable.  Previous history of rash/hives: yes but not like this.  Patient is up to date with the following cancer screening tests: physical exam, mammogram, pap smears.   Patient usually gets the flu shot annually with no issues.  No issues with latex or other vaccines.   Food 2019  skin testing was positive to weed, ragweed, trees, dust mites. 2019 skin testing was positive to almond and hazelnuts. 2020 bloodwork positive to shellfish and mollusks.   Currently avoiding shellfish.  3 weeks ago noted some itching after eating peanuts. She stopped eating hazelnuts as well. Does not usually eat other tree nuts.   Dietary History: patient has been eating other foods including milk, eggs, sesame, fish, soy, wheat, meats, fruits and vegetables.  Allergic rhino conjunctivitis Stable and does not take medications for this daily.  03/19/2021 PCP visit: "HPI: Presents with rash to her bilateral lower extremities and torso. Patient reports that the rash occurred after getting the flu shot approximately 2 to 3 weeks ago. She reports that she was seen by urgent care and was prescribed a steroid and given steroid cream. She reports that the rash is somewhat improved and now has returned and continues to be itchy. She reports larger areas of rash to her right buttocks and her left axilla area. She denies any changes in soaps, detergents, lotions. She denies any encounters with any insects and her husband does not have this same rash. He does note a history of allergy to a shellfish however she has not eaten any of this. She reports seeing an allergy specialist in 2019 for a similar outbreak however they determined this was due to this fish allergy."  04/02/2021 PCP visit: "She reports completing the course of steroids, allegra, and Pepcid. She reports once she completed the steroids the rash returned immediately. She continues to deny any contact with potential allergan as there has  not been any changes in sopas detergents or or cleaning solutions. No other family members have the rash. She reports areas to her buttocks, bilateral legs, and torso. Triamcinolone cream is not helping."  Assessment and Plan: Ariel is a 40 y.o. female with: Rash and other nonspecific skin eruption Broke  out in pruritic rash 1 day after the flu vaccine but now those rashes left hyperpigmented areas. Had 2 courses of systemic steroids with good benefit. Antihistamines not effective. 2022 bloodwork - ESR, CBC diff, CMP, ANA unremarkable. Denies any other changes in diet, meds, personal care products. Known allergy to shellfish which she has been avoiding. No prior issues with any vaccines. Discussed with patient that not sure what caused this rash. Unlikely to be related to foods/environmental allergies due to clinical history and presentation. Avoid future vaccines for now. Recommend flu shot skin testing next fall in our office prior to administration.  Get bloodwork to rule out other etiologies. Start zyrtec (cetirizine) 67m at night. Start allegra 1837min the morning.  If symptoms are not controlled or causes drowsiness let usKoreanow. Start Pepcid (famotidine) 2041mwice a day.  Avoid the following potential triggers: alcohol, tight clothing, NSAIDs, hot showers and getting overheated. Keep track of symptoms and take pictures. If no improvement, recommend dermatology evaluation - for possible skin biopsy.  See below for proper skin care.  Other adverse food reactions, not elsewhere classified, subsequent encounter Past history - 2019 skin testing was positive to almond and hazelnuts. 2020 bloodwork positive to shellfish and mollusks.  Continue to avoid shellfish, peanuts and tree nuts. For mild symptoms you can take over the counter antihistamines such as Benadryl and monitor symptoms closely. If symptoms worsen or if you have severe symptoms including breathing issues, throat closure, significant swelling, whole body hives, severe diarrhea and vomiting, lightheadedness then inject epinephrine and seek immediate medical care afterwards. Action plan given.  Get bloodwork for nut panel - patient was eating these foods up until a few weeks ago.   Seasonal and perennial allergic rhinitis Past  history - 2019 skin testing was positive to weed, ragweed, trees, dust mites. Interim history - asymptomatic. Monitor symptoms.   Return in about 4 weeks (around 05/20/2021).  Meds ordered this encounter  Medications   famotidine (PEPCID) 20 MG tablet    Sig: Take 1 tablet (20 mg total) by mouth 2 (two) times daily.    Dispense:  60 tablet    Refill:  3   EPINEPHrine 0.3 mg/0.3 mL IJ SOAJ injection    Sig: Inject 0.3 mg into the muscle as needed for anaphylaxis.    Dispense:  1 each    Refill:  2    May dispense generic/Mylan/Teva brand.   Lab Orders         IgE Nut Prof. w/Component Rflx         Alpha-Gal Panel         C3 and C4         Chronic Urticaria         Tryptase         Thyroid Cascade Profile         Sedimentation rate         C-reactive protein         Gelatin,IgE         Latex, IgE         Allergen, Bakers Yeast, f45      Other allergy screening: Asthma: no Medication allergy: no Hymenoptera  allergy: no Eczema:no History of recurrent infections suggestive of immunodeficency: no  Diagnostics: None.   Past Medical History: Patient Active Problem List   Diagnosis Date Noted   Rash and other nonspecific skin eruption 04/23/2021   Other adverse food reactions, not elsewhere classified, subsequent encounter 04/22/2021   Allergic reaction/rash 02/08/2018   Urticaria 02/08/2018   Frontal sinusitis 03/05/2015   Seasonal and perennial allergic rhinitis 09/13/2013   Eczema 04/02/2011   Keratosis pilaris 04/02/2011   Reflux 04/02/2011   Recurrent oral ulcers 04/02/2011   Past Medical History:  Diagnosis Date   Abdominal pain, left lateral 04/02/2011   Allergic urticaria 02/08/2018   Allergist eval 01/2018.  Shellfish allergy confirmed 04/2018.  ANA pos, o/w all normal labs (suspect false pos).   Eczema 04/02/2011   GERD (gastroesophageal reflux disease) 04/02/2011   Gestational diabetes    Keratosis pilaris 04/02/2011   Recurrent oral ulcers 04/02/2011    Vitamin D deficiency    High dose replacement 2018, then 2000 U qd-->vit D recheck still <30, so increased to TWO 2000 U tabs qd 09/2017. Then 2 of the 10K U tabs q week as of 12/15/17.   Past Surgical History: Past Surgical History:  Procedure Laterality Date   CESAREAN SECTION  2008   TOOTH EXTRACTION  2009   Medication List:  Current Outpatient Medications  Medication Sig Dispense Refill   EPINEPHrine 0.3 mg/0.3 mL IJ SOAJ injection Inject 0.3 mg into the muscle as needed for anaphylaxis. 1 each 2   famotidine (PEPCID) 20 MG tablet Take 1 tablet (20 mg total) by mouth 2 (two) times daily. 60 tablet 3   No current facility-administered medications for this visit.   Allergies: Allergies  Allergen Reactions   Shellfish Allergy Hives    +IgE testing   Codeine Nausea Only   Social History: Social History   Socioeconomic History   Marital status: Married    Spouse name: Not on file   Number of children: 1   Years of education: Not on file   Highest education level: Not on file  Occupational History   Occupation: housewife  Tobacco Use   Smoking status: Never   Smokeless tobacco: Never  Vaping Use   Vaping Use: Never used  Substance and Sexual Activity   Alcohol use: No   Drug use: No   Sexual activity: Yes  Other Topics Concern   Not on file  Social History Narrative   Not on file   Social Determinants of Health   Financial Resource Strain: Not on file  Food Insecurity: Not on file  Transportation Needs: Not on file  Physical Activity: Not on file  Stress: Not on file  Social Connections: Not on file   Lives in a 42 year old house. Smoking: denies Occupation: Control and instrumentation engineer HistoryFreight forwarder in the house: no Charity fundraiser in the family room: no Carpet in the bedroom: no Heating: heat pump Cooling: heat pump Pet: yes 1 dog  x 10 yrs  Family History: Family History  Problem Relation Age of Onset   Allergic rhinitis Neg Hx     Angioedema Neg Hx    Asthma Neg Hx    Eczema Neg Hx    Immunodeficiency Neg Hx    Urticaria Neg Hx    Review of Systems  Constitutional:  Negative for appetite change, chills, fever and unexpected weight change.  HENT:  Negative for congestion and rhinorrhea.   Eyes:  Negative for itching.  Respiratory:  Negative for  cough, chest tightness, shortness of breath and wheezing.   Cardiovascular:  Negative for chest pain.  Gastrointestinal:  Negative for abdominal pain.  Genitourinary:  Negative for difficulty urinating.  Skin:  Positive for rash.  Allergic/Immunologic: Positive for environmental allergies and food allergies.  Neurological:  Negative for headaches.   Objective: BP 110/66    Pulse 70    Temp 98.4 F (36.9 C) (Temporal)    Resp 16    Ht 5' 1"  (1.549 m)    Wt 117 lb 12.8 oz (53.4 kg)    SpO2 99%    BMI 22.26 kg/m  Body mass index is 22.26 kg/m. Physical Exam Vitals and nursing note reviewed.  Constitutional:      Appearance: Normal appearance. She is well-developed.  HENT:     Head: Normocephalic and atraumatic.     Right Ear: Tympanic membrane and external ear normal.     Left Ear: Tympanic membrane and external ear normal.     Nose: Nose normal.     Mouth/Throat:     Mouth: Mucous membranes are moist.     Pharynx: Oropharynx is clear.  Eyes:     Conjunctiva/sclera: Conjunctivae normal.  Cardiovascular:     Rate and Rhythm: Normal rate and regular rhythm.     Heart sounds: Normal heart sounds. No murmur heard.   No friction rub. No gallop.  Pulmonary:     Effort: Pulmonary effort is normal.     Breath sounds: Normal breath sounds. No wheezing, rhonchi or rales.  Musculoskeletal:     Cervical back: Neck supple.  Skin:    General: Skin is warm.     Findings: Rash present.     Comments: Hyperpigmented patches on the right upper thigh and buttocks area. Scattered circular hyperpigmented areas on the torso.  Neurological:     Mental Status: She is alert and  oriented to person, place, and time.  Psychiatric:        Behavior: Behavior normal.  The plan was reviewed with the patient/family, and all questions/concerned were addressed.  It was my pleasure to see Tirsa today and participate in her care. Please feel free to contact me with any questions or concerns.  Sincerely,  Rexene Alberts, DO Allergy & Immunology  [facility] office: [place] office: [phone]

## 2021-04-22 NOTE — Assessment & Plan Note (Signed)
Past history - 2019 skin testing was positive to weed, ragweed, trees, dust mites. Interim history - asymptomatic.  Monitor symptoms.

## 2021-04-22 NOTE — Patient Instructions (Addendum)
Avoid future vaccines for now.  Recommend flu shot testing next fall in our office.  Rash/itching:  Start zyrtec (cetirizine) 10mg  at night. Start allegra 180mg  in the morning.  If symptoms are not controlled or causes drowsiness let know. Start pepcid (famotidine) 20mg  twice a day.  Avoid the following potential triggers: alcohol, tight clothing, NSAIDs, hot showers and getting overheated. Keep track of symptoms and take pictures. If no improvement, recommend dermatology evaluation - possibly skin biopsy.  See below for proper skin care. Get bloodwork:  We are ordering labs, so please allow 1-2 weeks for the results to come back. With the newly implemented Cures Act, the labs might be visible to you at the same time that they become visible to me. However, I will not address the results until all of the results are back, so please be patient.    Food: Continue to avoid shellfish, peanuts and tree nuts. For mild symptoms you can take over the counter antihistamines such as Benadryl and monitor symptoms closely. If symptoms worsen or if you have severe symptoms including breathing issues, throat closure, significant swelling, whole body hives, severe diarrhea and vomiting, lightheadedness then inject epinephrine and seek immediate medical care afterwards.  Follow up in 4 weeks or sooner if needed.  Skin care recommendations  Bath time: Always use lukewarm water. AVOID very hot or cold water. Keep bathing time to 5-10 minutes. Do NOT use bubble bath. Use a mild soap and use just enough to wash the dirty areas. Do NOT scrub skin vigorously.  After bathing, pat dry your skin with a towel. Do NOT rub or scrub the skin.  Moisturizers and prescriptions:  ALWAYS apply moisturizers immediately after bathing (within 3 minutes). This helps to lock-in moisture. Use the moisturizer several times a day over the whole body. Good summer moisturizers include: Aveeno, CeraVe, Cetaphil. Good  winter moisturizers include: Aquaphor, Vaseline, Cerave, Cetaphil, Eucerin, Vanicream. When using moisturizers along with medications, the moisturizer should be applied about one hour after applying the medication to prevent diluting effect of the medication or moisturize around where you applied the medications. When not using medications, the moisturizer can be continued twice daily as maintenance.  Laundry and clothing: Avoid laundry products with added color or perfumes. Use unscented hypo-allergenic laundry products such as Tide free, Cheer free & gentle, and All free and clear.  If the skin still seems dry or sensitive, you can try double-rinsing the clothes. Avoid tight or scratchy clothing such as wool. Do not use fabric softeners or dyer sheets.

## 2021-04-23 DIAGNOSIS — R21 Rash and other nonspecific skin eruption: Secondary | ICD-10-CM | POA: Insufficient documentation

## 2021-04-23 MED ORDER — EPINEPHRINE 0.3 MG/0.3ML IJ SOAJ: 0.3000 mg | INTRAMUSCULAR | 2 refills | Status: DC | PRN

## 2021-04-23 NOTE — Assessment & Plan Note (Signed)
Broke out in pruritic rash 1 day after the flu vaccine but now those rashes left hyperpigmented areas. Had 2 courses of systemic steroids with good benefit. Antihistamines not effective. 2022 bloodwork - ESR, CBC diff, CMP, ANA unremarkable. Denies any other changes in diet, meds, personal care products. Known allergy to shellfish which she has been avoiding. No prior issues with any vaccines.  Discussed with patient that not sure what caused this rash. Unlikely to be related to foods/environmental allergies due to clinical history and presentation.  Avoid future vaccines for now.  Recommend flu shot skin testing next fall in our office prior to administration.   Get bloodwork to rule out other etiologies.  Start zyrtec (cetirizine) 21m at night.  Start allegra 1812min the morning.   If symptoms are not controlled or causes drowsiness let usKoreanow.  Start Pepcid (famotidine) 2048mwice a day.   Avoid the following potential triggers: alcohol, tight clothing, NSAIDs, hot showers and getting overheated.  Keep track of symptoms and take pictures.  If no improvement, recommend dermatology evaluation - for possible skin biopsy.   See below for proper skin care.

## 2021-04-23 NOTE — Assessment & Plan Note (Signed)
Past history - 2019 skin testing was positive to almond and hazelnuts. 2020 bloodwork positive to shellfish and mollusks.   Continue to avoid shellfish, peanuts and tree nuts.  For mild symptoms you can take over the counter antihistamines such as Benadryl and monitor symptoms closely. If symptoms worsen or if you have severe symptoms including breathing issues, throat closure, significant swelling, whole body hives, severe diarrhea and vomiting, lightheadedness then inject epinephrine and seek immediate medical care afterwards.  Action plan given.   Get bloodwork for nut panel - patient was eating these foods up until a few weeks ago.

## 2021-05-07 LAB — PEANUT COMPONENTS
F352-IgE Ara h 8: <0.1 kU/L
F422-IgE Ara h 1: <0.1 kU/L
F423-IgE Ara h 2: <0.1 kU/L
F424-IgE Ara h 3: <0.1 kU/L
F427-IgE Ara h 9: <0.1 kU/L
F447-IgE Ara h 6: <0.1 kU/L

## 2021-05-07 LAB — ALPHA-GAL PANEL
Allergen Lamb IgE: <0.1 kU/L
Beef IgE: <0.1 kU/L
IgE (Immunoglobulin E), Serum: 185 IU/mL (ref 6–495)
O215-IgE Alpha-Gal: <0.1 kU/L
Pork IgE: <0.1 kU/L

## 2021-05-07 LAB — PANEL 604721
Jug R 1 IgE: <0.1 kU/L
Jug R 3 IgE: <0.1 kU/L

## 2021-05-07 LAB — TRYPTASE: Tryptase: 3.2 ug/L (ref 2.2–13.2)

## 2021-05-07 LAB — SEDIMENTATION RATE: Sed Rate: 15 mm/hr (ref 0–32)

## 2021-05-07 LAB — IGE NUT PROF. W/COMPONENT RFLX
F017-IgE Hazelnut (Filbert): 0.15 kU/L — AB
F018-IgE Brazil Nut: <0.1 kU/L
F020-IgE Almond: 0.12 kU/L — AB
F202-IgE Cashew Nut: <0.1 kU/L
F203-IgE Pistachio Nut: 0.16 kU/L — AB
F256-IgE Walnut: 0.17 kU/L — AB
Macadamia Nut, IgE: 0.25 kU/L — AB
Peanut, IgE: 0.21 kU/L — AB
Pecan Nut IgE: <0.1 kU/L

## 2021-05-07 LAB — PANEL 604726
Cor A 1 IgE: <0.1 kU/L
Cor A 14 IgE: <0.1 kU/L
Cor A 8 IgE: <0.1 kU/L
Cor A 9 IgE: <0.1 kU/L

## 2021-05-07 LAB — C074-IGE GELATIN: C074-IgE Gelatin: <0.1 kU/L

## 2021-05-07 LAB — C-REACTIVE PROTEIN: CRP: <1 mg/L (ref 0–10)

## 2021-05-07 LAB — LATEX, IGE: Latex IgE: <0.1 kU/L

## 2021-05-07 LAB — ALLERGEN COMPONENT COMMENTS

## 2021-05-07 LAB — C3 AND C4
Complement C3, Serum: 114 mg/dL (ref 82–167)
Complement C4, Serum: 24 mg/dL (ref 12–38)

## 2021-05-07 LAB — ALLERGEN, BAKERS YEAST, F45: F045-IgE Yeast: <0.1 kU/L

## 2021-05-07 LAB — CHRONIC URTICARIA: cu index: <3.3 (ref <10)

## 2021-05-07 LAB — THYROID CASCADE PROFILE: TSH: 2.46 u[IU]/mL (ref 0.450–4.500)

## 2021-06-04 NOTE — Progress Notes (Signed)
Follow Up Note  RE: Yolanda Mccoy MRN: [id number] DOB: [birth year] Date of Office Visit: 06/05/2021  Referring provider: No ref. provider found Primary care provider: Teodora Medici, FNP  Chief Complaint: Follow-up  History of Present Illness: I had the pleasure of seeing [name] for a follow up visit at the [facility] [place] on 06/05/2021. She is a 41 y.o. female, who is being followed for rash, adverse food reaction and allergic rhinitis. Her previous allergy office visit was on 04/22/2021 with Dr. Maudie Mercury. Today is a regular follow up visit. She is accompanied today by her significant other who provided/contributed to the history.   Rash/itching: Itching resolved. No new rashes. Old rash is getting better.  Currently avoiding tree nuts, coffee. Taking allegra 187m twice a day and famotidine 233mtwice a day.  The last 3 nights she didn't take her medications at night and has no issues.   Food: Avoiding shellfish, peanuts and tree nuts.   Seasonal and perennial allergic rhinitis Asymptomatic.  Assessment and Plan: KwKaydences a [age].o. female with: Rash and other nonspecific skin eruption Past history - Broke out in pruritic rash 1 day after the flu vaccine but now those rashes left hyperpigmented areas. Had 2 courses of systemic steroids with good benefit. Antihistamines not effective. 2022 bloodwork - ESR, CBC diff, CMP, ANA unremarkable. Denies any other changes in diet, meds, personal care products. Known allergy to shellfish which she has been avoiding. No prior issues with any vaccines. Interim history - no itching or new rash, avoiding all tree nuts/peanuts. Only taking allegra and Pepcid in the mornings. 2022 bloodwork (TSH, ANA, ESR, CRP, CU, tryptase, Alpha gal) all normal. Borderline positive to hazelnut, walnut, peanut, macadamia nut, pistachio and almond. Negative to gelatin, yeast, and latex Recommend flu shot testing next fall in our  office. Continue allegra 18083mn the morning and famotidine 78m72m the morning. If no symptoms after 2 weeks then stop famotidine. If no symptoms after 2 weeks then stop allegra. If you notice symptoms then restart medications. Avoid the following potential triggers: alcohol, tight clothing, NSAIDs, hot showers and getting overheated. Keep track of symptoms and take pictures. If no improvement, recommend dermatology evaluation - possibly skin biopsy.  Continue proper skin care.  Other adverse food reactions, not elsewhere classified, subsequent encounter Past history - 2019 skin testing was positive to almond and hazelnuts. 2020 bloodwork positive to shellfish and mollusks.  Interim history - 2022 bloodwork borderline positive to hazelnut, walnut, peanut, macadamia nut, pistachio and almond Continue to avoid shellfish, peanuts and tree nuts. If interested we can schedule food challenge to tree nuts then peanuts. You must be off antihistamines for 3-5 days before. Must be in good health and not ill. No vaccines/injections within the past 7 days. Not on any antibiotics. Plan on being in the office for 2-3 hours and must bring in the food you want to do the oral challenge for - mixed tree nut butter/peanut butter. You must call to schedule an appointment and specify it's for a food challenge.  For mild symptoms you can take over the counter antihistamines such as Benadryl and monitor symptoms closely. If symptoms worsen or if you have severe symptoms including breathing issues, throat closure, significant swelling, whole body hives, severe diarrhea and vomiting, lightheadedness then inject epinephrine and seek immediate medical care afterwards.  Seasonal and perennial allergic rhinitis Past history - 2019 skin testing was positive to weed, ragweed, trees, dust mites.  Interim history - asymptomatic. Monitor symptoms.   Return in about 8 months (around 02/02/2022).  No orders of the defined types  were placed in this encounter.  Lab Orders  No laboratory test(s) ordered today    Diagnostics: None.   Medication List:  Current Outpatient Medications  Medication Sig Dispense Refill   EPINEPHrine 0.3 mg/0.3 mL IJ SOAJ injection Inject 0.3 mg into the muscle as needed for anaphylaxis. 1 each 2   famotidine (PEPCID) 20 MG tablet Take 1 tablet (20 mg total) by mouth 2 (two) times daily. 60 tablet 3   No current facility-administered medications for this visit.   Allergies: Allergies  Allergen Reactions   Shellfish Allergy Hives    +IgE testing   Codeine Nausea Only   I reviewed her past medical history, social history, family history, and environmental history and no significant changes have been reported from her previous visit.  Review of Systems  Constitutional:  Negative for appetite change, chills, fever and unexpected weight change.  HENT:  Negative for congestion and rhinorrhea.   Eyes:  Negative for itching.  Respiratory:  Negative for cough, chest tightness, shortness of breath and wheezing.   Cardiovascular:  Negative for chest pain.  Gastrointestinal:  Negative for abdominal pain.  Genitourinary:  Negative for difficulty urinating.  Skin:  Positive for rash.  Allergic/Immunologic: Positive for environmental allergies and food allergies.  Neurological:  Negative for headaches.   Objective: BP 118/72 (BP Location: Right Arm, Patient Position: Sitting, Cuff Size: Normal)    Pulse 71    Temp 98 F (36.7 C) (Temporal)    Resp 16    SpO2 100%  There is no height or weight on file to calculate BMI. Physical Exam Vitals and nursing note reviewed.  Constitutional:      Appearance: Normal appearance. She is well-developed.  HENT:     Head: Normocephalic and atraumatic.     Right Ear: Tympanic membrane and external ear normal.     Left Ear: Tympanic membrane and external ear normal.     Nose: Nose normal.     Mouth/Throat:     Mouth: Mucous membranes are moist.      Pharynx: Oropharynx is clear.  Eyes:     Conjunctiva/sclera: Conjunctivae normal.  Cardiovascular:     Rate and Rhythm: Normal rate and regular rhythm.     Heart sounds: Normal heart sounds. No murmur heard.   No friction rub. No gallop.  Pulmonary:     Effort: Pulmonary effort is normal.     Breath sounds: Normal breath sounds. No wheezing, rhonchi or rales.  Musculoskeletal:     Cervical back: Neck supple.  Skin:    General: Skin is warm.     Findings: Rash present.     Comments: Hyperpigmented patches on the right upper thigh.  Neurological:     Mental Status: She is alert and oriented to person, place, and time.  Psychiatric:        Behavior: Behavior normal.   Previous notes and tests were reviewed. The plan was reviewed with the patient/family, and all questions/concerned were addressed.  It was my pleasure to see Carlena today and participate in her care. Please feel free to contact me with any questions or concerns.  Sincerely,  Rexene Alberts, DO Allergy & Immunology  [facility] office: [place] office: [phone]

## 2021-06-05 ENCOUNTER — Ambulatory Visit: Payer: 59 | Admitting: Allergy

## 2021-06-05 ENCOUNTER — Other Ambulatory Visit: Payer: Self-pay

## 2021-06-05 ENCOUNTER — Encounter: Payer: Self-pay | Admitting: Allergy

## 2021-06-05 VITALS — BP 118/72 | HR 71 | Temp 98.0°F | Resp 16

## 2021-06-05 DIAGNOSIS — J3089 Other allergic rhinitis: Secondary | ICD-10-CM | POA: Diagnosis not present

## 2021-06-05 DIAGNOSIS — J302 Other seasonal allergic rhinitis: Secondary | ICD-10-CM

## 2021-06-05 DIAGNOSIS — R21 Rash and other nonspecific skin eruption: Secondary | ICD-10-CM | POA: Diagnosis not present

## 2021-06-05 DIAGNOSIS — T781XXD Other adverse food reactions, not elsewhere classified, subsequent encounter: Secondary | ICD-10-CM

## 2021-06-05 NOTE — Assessment & Plan Note (Signed)
Past history - 2019 skin testing was positive to weed, ragweed, trees, dust mites. Interim history - asymptomatic.  Monitor symptoms.

## 2021-06-05 NOTE — Assessment & Plan Note (Addendum)
Past history - Broke out in pruritic rash 1 day after the flu vaccine but now those rashes left hyperpigmented areas. Had 2 courses of systemic steroids with good benefit. Antihistamines not effective. 2022 bloodwork - ESR, CBC diff, CMP, ANA unremarkable. Denies any other changes in diet, meds, personal care products. Known allergy to shellfish which she has been avoiding. No prior issues with any vaccines. Interim history - no itching or new rash, avoiding all tree nuts/peanuts. Only taking allegra and Pepcid in the mornings. 2022 bloodwork (TSH, ANA, ESR, CRP, CU, tryptase, Alpha gal) all normal. Borderline positive to hazelnut, walnut, peanut, macadamia nut, pistachio and almond. Negative to gelatin, yeast, and latex  Recommend flu shot testing next fall in our office.  Continue allegra 155m in the morning and famotidine 254min the morning.  If no symptoms after 2 weeks then stop famotidine.  If no symptoms after 2 weeks then stop allegra.  If you notice symptoms then restart medications.  Avoid the following potential triggers: alcohol, tight clothing, NSAIDs, hot showers and getting overheated.  Keep track of symptoms and take pictures.  If no improvement, recommend dermatology evaluation - possibly skin biopsy.   Continue proper skin care.

## 2021-06-05 NOTE — Patient Instructions (Addendum)
Recommend flu shot testing next fall in our office.  Rash/itching:  Continue allegra 180mg  in the morning and famotidine 20mg  in the morning. If no symptoms after 2 weeks then stop famotidine. If no symptoms after 2 weeks then stop allegra. If you notice symptoms then restart medications.  Avoid the following potential triggers: alcohol, tight clothing, NSAIDs, hot showers and getting overheated. Keep track of symptoms and take pictures. If no improvement, recommend dermatology evaluation - possibly skin biopsy.  Continue proper skin care.  Food: Continue to avoid shellfish, peanuts and tree nuts. If interested we can schedule food challenge to tree nuts. You must be off antihistamines for 3-5 days before. Must be in good health and not ill. No vaccines/injections within the past 7 days. Not on any antibiotics. Plan on being in the office for 2-3 hours and must bring in the food you want to do the oral challenge for - mixed tree nut butter. You must call to schedule an appointment and specify it's for a food challenge.   For mild symptoms you can take over the counter antihistamines such as Benadryl and monitor symptoms closely. If symptoms worsen or if you have severe symptoms including breathing issues, throat closure, significant swelling, whole body hives, severe diarrhea and vomiting, lightheadedness then inject epinephrine and seek immediate medical care afterwards.  Follow up in October or sooner if needed. Will plan on giving flu shot then in the office.   Skin care recommendations  Bath time: Always use lukewarm water. AVOID very hot or cold water. Keep bathing time to 5-10 minutes. Do NOT use bubble bath. Use a mild soap and use just enough to wash the dirty areas. Do NOT scrub skin vigorously.  After bathing, pat dry your skin with a towel. Do NOT rub or scrub the skin.  Moisturizers and prescriptions:  ALWAYS apply moisturizers immediately after bathing (within 3 minutes).  This helps to lock-in moisture. Use the moisturizer several times a day over the whole body. Good summer moisturizers include: Aveeno, CeraVe, Cetaphil. Good winter moisturizers include: Aquaphor, Vaseline, Cerave, Cetaphil, Eucerin, Vanicream. When using moisturizers along with medications, the moisturizer should be applied about one hour after applying the medication to prevent diluting effect of the medication or moisturize around where you applied the medications. When not using medications, the moisturizer can be continued twice daily as maintenance.  Laundry and clothing: Avoid laundry products with added color or perfumes. Use unscented hypo-allergenic laundry products such as Tide free, Cheer free & gentle, and All free and clear.  If the skin still seems dry or sensitive, you can try double-rinsing the clothes. Avoid tight or scratchy clothing such as wool. Do not use fabric softeners or dyer sheets.

## 2021-06-05 NOTE — Assessment & Plan Note (Addendum)
Past history - 2019 skin testing was positive to almond and hazelnuts. 2020 bloodwork positive to shellfish and mollusks.  Interim history - 2022 bloodwork borderline positive to hazelnut, walnut, peanut, macadamia nut, pistachio and almond  Continue to avoid shellfish, peanuts and tree nuts.  If interested we can schedule food challenge to tree nuts then peanuts. You must be off antihistamines for 3-5 days before. Must be in good health and not ill. No vaccines/injections within the past 7 days. Not on any antibiotics. Plan on being in the office for 2-3 hours and must bring in the food you want to do the oral challenge for - mixed tree nut butter/peanut butter. You must call to schedule an appointment and specify it's for a food challenge.   For mild symptoms you can take over the counter antihistamines such as Benadryl and monitor symptoms closely. If symptoms worsen or if you have severe symptoms including breathing issues, throat closure, significant swelling, whole body hives, severe diarrhea and vomiting, lightheadedness then inject epinephrine and seek immediate medical care afterwards.

## 2022-01-26 NOTE — Progress Notes (Signed)
Follow Up Note  RE: Yolanda Mccoy MRN: [id number] DOB: 1980/09/13 Date of Office Visit: 01/27/2022  Referring provider: Teodora Medici, FNP Primary care provider: Teodora Medici, FNP  Chief Complaint: Rash (Still having some spots but having no itching ), Allergic Rhinitis , and Other (Is still avoiding shellfish,peanuts,tree nuts/)  History of Present Illness: I had the pleasure of seeing Yolanda Mccoy for a follow up visit at the [facility] [place] on 01/27/2022. She is a 41 y.o. female, who is being followed for rash, adverse food reaction and allergic rhinitis. Her previous allergy office visit was on 06/05/2021 with Dr. Maudie Mercury. Today is a regular follow up visit. She is accompanied today by her significant other who provided/contributed to the history.   Rash/itching Resolved with no additional flares. She was able to stop both famotidine and allegra with no issues.  She still has some areas that are fading.   Food:  Avoiding shrimp, peanuts and tree nuts and not interested in reintroducing back into her diet.  She was traveling in [place] in July and accidentally had some sauce that had shrimp in it. Immediately she started to have itching/rash on her neck/face. She took benadryl, allegra and famotidine and symptoms resolved within the same day.   Tolerates crab, finned fish with no issues.   Not interested in peanuts/tree nuts reintroduction.  Using sunbutter.  Seasonal and perennial allergic rhinitis Asymptomatic.   Assessment and Plan: Yolanda Mccoy is a 41 y.o. female with: Rash and other nonspecific skin eruption Past history - Broke out in pruritic rash 1 day after the flu vaccine but now those rashes left hyperpigmented areas. Had 2 courses of systemic steroids with good benefit. Antihistamines not effective. 2022 bloodwork - ESR, CBC diff, CMP, ANA unremarkable. Denies any other changes in diet, meds, personal care products. Known allergy to  shellfish which she has been avoiding. No prior issues with any vaccines. 2022 bloodwork (TSH, ANA, ESR, CRP, CU, tryptase, Alpha gal) all normal. Borderline positive to hazelnut, walnut, peanut, macadamia nut, pistachio and almond. Negative to gelatin, yeast, and latex.  Interim history - able to stop allegra and famotidine with no issues. Asymptomatic.  If you have a flare then you may take allegra 116m twice a day and famotidine 230mtwice a day.  Avoid the following potential triggers: alcohol, tight clothing, NSAIDs, hot showers and getting overheated. Keep track of symptoms and take pictures. Continue proper skin care. Flu shot given in the office today. If you have any issues afterwards let usKoreanow. If no issues then okay to get flu shot next year at the PCP's office or pharmacy.  Anaphylactic reaction due to food, subsequent encounter Past history - 2019 skin testing was positive to almond and hazelnuts. 2020 bloodwork positive to shellfish and mollusks. 2022 bloodwork borderline positive to hazelnut, walnut, peanut, macadamia nut, pistachio and almond Interim history - broke out in pruritic rash after eating shrimp (treated with antihistamines), no issues with crab. Not interested in peanuts/tree nuts reintroduction.  Continue to avoid shellfish - shrimp, peanuts and tree nuts. Okay to eat crab - just be careful with cross contamination.  If interested we can schedule food challenge to tree nuts. You must be off antihistamines for 3-5 days before. Must be in good health and not ill. No vaccines/injections within the past 7 days. Not on any antibiotics. Plan on being in the office for 2-3 hours and must bring in the food you want to do the  oral challenge for - mixed tree nut butter. You must call to schedule an appointment and specify it's for a food challenge.  For mild symptoms you can take over the counter antihistamines such as Benadryl and monitor symptoms closely. If symptoms worsen or  if you have severe symptoms including breathing issues, throat closure, significant swelling, whole body hives, severe diarrhea and vomiting, lightheadedness then inject epinephrine and seek immediate medical care afterwards. Action plan updated.   Return in about 1 year (around 01/28/2023).  No orders of the defined types were placed in this encounter.  Lab Orders  No laboratory test(s) ordered today    Diagnostics: None.   Medication List:  Current Outpatient Medications  Medication Sig Dispense Refill   COLLAGEN PO Take by mouth.     EPINEPHrine 0.3 mg/0.3 mL IJ SOAJ injection Inject 0.3 mg into the muscle as needed for anaphylaxis. 1 each 2   VITAMIN D PO Take by mouth.     No current facility-administered medications for this visit.   Allergies: Allergies  Allergen Reactions   Shellfish Allergy Hives    +IgE testing, shrimp mainly   Codeine Nausea Only   I reviewed her past medical history, social history, family history, and environmental history and no significant changes have been reported from her previous visit.  Review of Systems  Constitutional:  Negative for appetite change, chills, fever and unexpected weight change.  HENT:  Negative for congestion and rhinorrhea.   Eyes:  Negative for itching.  Respiratory:  Negative for cough, chest tightness, shortness of breath and wheezing.   Cardiovascular:  Negative for chest pain.  Gastrointestinal:  Negative for abdominal pain.  Genitourinary:  Negative for difficulty urinating.  Skin:  Negative for rash.  Allergic/Immunologic: Positive for environmental allergies and food allergies.  Neurological:  Negative for headaches.    Objective: BP (!) 112/58   Pulse (!) 58   Temp 98.1 F (36.7 C)   Resp 16   Ht 5' 1"  (1.549 m)   Wt 116 lb 12 oz (53 kg)   SpO2 99%   BMI 22.06 kg/m  Body mass index is 22.06 kg/m. Physical Exam Vitals and nursing note reviewed.  Constitutional:      Appearance: Normal  appearance. She is well-developed.  HENT:     Head: Normocephalic and atraumatic.     Right Ear: Tympanic membrane and external ear normal.     Left Ear: Tympanic membrane and external ear normal.     Nose: Nose normal.     Mouth/Throat:     Mouth: Mucous membranes are moist.     Pharynx: Oropharynx is clear.  Eyes:     Conjunctiva/sclera: Conjunctivae normal.  Cardiovascular:     Rate and Rhythm: Normal rate and regular rhythm.     Heart sounds: Normal heart sounds. No murmur heard.    No friction rub. No gallop.  Pulmonary:     Effort: Pulmonary effort is normal.     Breath sounds: Normal breath sounds. No wheezing, rhonchi or rales.  Musculoskeletal:     Cervical back: Neck supple.  Skin:    General: Skin is warm.     Findings: No rash.  Neurological:     Mental Status: She is alert and oriented to person, place, and time.  Psychiatric:        Behavior: Behavior normal.    Previous notes and tests were reviewed. The plan was reviewed with the patient/family, and all questions/concerned were addressed.  It was  my pleasure to see Yolanda Mccoy today and participate in her care. Please feel free to contact me with any questions or concerns.  Sincerely,  Rexene Alberts, DO Allergy & Immunology  [facility] office: [place] office: [phone]

## 2022-01-27 ENCOUNTER — Encounter: Payer: Self-pay | Admitting: Allergy

## 2022-01-27 ENCOUNTER — Ambulatory Visit: Payer: 59 | Admitting: Allergy

## 2022-01-27 VITALS — BP 112/58 | HR 58 | Temp 98.1°F | Resp 16 | Ht 61.0 in | Wt 116.8 lb

## 2022-01-27 DIAGNOSIS — R21 Rash and other nonspecific skin eruption: Secondary | ICD-10-CM

## 2022-01-27 DIAGNOSIS — J302 Other seasonal allergic rhinitis: Secondary | ICD-10-CM

## 2022-01-27 DIAGNOSIS — T781XXD Other adverse food reactions, not elsewhere classified, subsequent encounter: Secondary | ICD-10-CM

## 2022-01-27 DIAGNOSIS — Z23 Encounter for immunization: Secondary | ICD-10-CM | POA: Diagnosis not present

## 2022-01-27 DIAGNOSIS — J3089 Other allergic rhinitis: Secondary | ICD-10-CM | POA: Diagnosis not present

## 2022-01-27 DIAGNOSIS — T7800XD Anaphylactic reaction due to unspecified food, subsequent encounter: Secondary | ICD-10-CM | POA: Diagnosis not present

## 2022-01-27 NOTE — Patient Instructions (Addendum)
You got the flu shot in our office today. Handout given.  If you have any issues afterwards let us know. If no issues then okay to get flu shot next year at the PCP's office or pharmacy.  Rash/itching:  If you have a flare then you may take allegra 180mg  twice a day and famotidine 20mg  twice a day.  Avoid the following potential triggers: alcohol, tight clothing, NSAIDs, hot showers and getting overheated. Keep track of symptoms and take pictures. Continue proper skin care.  Food: Continue to avoid shellfish - shrimp, peanuts and tree nuts. Okay to eat crab - just be careful with cross contamination.  If interested we can schedule food challenge to tree nuts. You must be off antihistamines for 3-5 days before. Must be in good health and not ill. No vaccines/injections within the past 7 days. Not on any antibiotics. Plan on being in the office for 2-3 hours and must bring in the food you want to do the oral challenge for - mixed tree nut butter. You must call to schedule an appointment and specify it's for a food challenge.   For mild symptoms you can take over the counter antihistamines such as Benadryl and monitor symptoms closely. If symptoms worsen or if you have severe symptoms including breathing issues, throat closure, significant swelling, whole body hives, severe diarrhea and vomiting, lightheadedness then inject epinephrine and seek immediate medical care afterwards. Action plan updated.   Follow up in 1 year or sooner if needed.

## 2022-01-27 NOTE — Assessment & Plan Note (Addendum)
Past history - Broke out in pruritic rash 1 day after the flu vaccine but now those rashes left hyperpigmented areas. Had 2 courses of systemic steroids with good benefit. Antihistamines not effective. 2022 bloodwork - ESR, CBC diff, CMP, ANA unremarkable. Denies any other changes in diet, meds, personal care products. Known allergy to shellfish which she has been avoiding. No prior issues with any vaccines. 2022 bloodwork (TSH, ANA, ESR, CRP, CU, tryptase, Alpha gal) all normal. Borderline positive to hazelnut, walnut, peanut, macadamia nut, pistachio and almond. Negative to gelatin, yeast, and latex.  Interim history - able to stop allegra and famotidine with no issues. Asymptomatic.   If you have a flare then you may take allegra 180mg twice a day and famotidine 20mg twice a day.  . Avoid the following potential triggers: alcohol, tight clothing, NSAIDs, hot showers and getting overheated. . Keep track of symptoms and take pictures. . Continue proper skin care. . Flu shot given in the office today. o If you have any issues afterwards let us know. o If no issues then okay to get flu shot next year at the PCP's office or pharmacy. 

## 2022-01-27 NOTE — Assessment & Plan Note (Signed)
Past history - 2019 skin testing was positive to almond and hazelnuts. 2020 bloodwork positive to shellfish and mollusks. 2022 bloodwork borderline positive to hazelnut, walnut, peanut, macadamia nut, pistachio and almond Interim history - broke out in pruritic rash after eating shrimp (treated with antihistamines), no issues with crab. Not interested in peanuts/tree nuts reintroduction.  . Continue to avoid shellfish - shrimp, peanuts and tree nuts. o Okay to eat crab - just be careful with cross contamination.  . If interested we can schedule food challenge to tree nuts. You must be off antihistamines for 3-5 days before. Must be in good health and not ill. No vaccines/injections within the past 7 days. Not on any antibiotics. Plan on being in the office for 2-3 hours and must bring in the food you want to do the oral challenge for - mixed tree nut butter. You must call to schedule an appointment and specify it's for a food challenge.  . For mild symptoms you can take over the counter antihistamines such as Benadryl and monitor symptoms closely. If symptoms worsen or if you have severe symptoms including breathing issues, throat closure, significant swelling, whole body hives, severe diarrhea and vomiting, lightheadedness then inject epinephrine and seek immediate medical care afterwards. . Action plan updated.

## 2022-02-04 ENCOUNTER — Ambulatory Visit: Payer: 59 | Admitting: Allergy

## 2023-10-04 NOTE — Progress Notes (Unsigned)
 Follow Up Note  RE: Yolanda Mccoy MRN: [id number] DOB: 1980-09-19 Date of Office Visit: 10/05/2023  Referring provider: Artie Laster, FNP Primary care provider: Artie Laster, FNP  Chief Complaint: No chief complaint on file.  History of Present Illness: I had the pleasure of seeing Yolanda Mccoy for a follow up visit at the [facility] on 10/04/2023. She is a 43 y.o. female, who is being followed for rash and food allergy. Her previous allergy office visit was on 01/27/2022 with Dr. Burdette Carolin. Today is a regular follow up visit.  Discussed the use of AI scribe software for clinical note transcription with the patient, who gave verbal consent to proceed.  History of Present Illness            ***  Assessment and Plan: Yolanda Mccoy is a 43 y.o. female with: Rash and other nonspecific skin eruption Past history - Broke out in pruritic rash 1 day after the flu vaccine but now those rashes left hyperpigmented areas. Had 2 courses of systemic steroids with good benefit. Antihistamines not effective. 2022 bloodwork - ESR, CBC diff, CMP, ANA unremarkable. Denies any other changes in diet, meds, personal care products. Known allergy to shellfish which she has been avoiding. No prior issues with any vaccines. 2022 bloodwork (TSH, ANA, ESR, CRP, CU, tryptase, Alpha gal) all normal. Borderline positive to hazelnut, walnut, peanut, macadamia nut, pistachio and almond. Negative to gelatin, yeast, and latex.  Interim history - able to stop allegra  and famotidine  with no issues. Asymptomatic.  If you have a flare then you may take allegra  180mg  twice a day and famotidine  20mg  twice a day.  Avoid the following potential triggers: alcohol, tight clothing, NSAIDs, hot showers and getting overheated. Keep track of symptoms and take pictures. Continue proper skin care. Flu shot given in the office today. If you have any issues afterwards let us  know. If no issues then  okay to get flu shot next year at the PCP's office or pharmacy.   Anaphylactic reaction due to food, subsequent encounter Past history - 2019 skin testing was positive to almond and hazelnuts. 2020 bloodwork positive to shellfish and mollusks. 2022 bloodwork borderline positive to hazelnut, walnut, peanut, macadamia nut, pistachio and almond Interim history - broke out in pruritic rash after eating shrimp (treated with antihistamines), no issues with crab. Not interested in peanuts/tree nuts reintroduction.  Continue to avoid shellfish - shrimp, peanuts and tree nuts. Okay to eat crab - just be careful with cross contamination.  If interested we can schedule food challenge to tree nuts. You must be off antihistamines for 3-5 days before. Must be in good health and not ill. No vaccines/injections within the past 7 days. Not on any antibiotics. Plan on being in the office for 2-3 hours and must bring in the food you want to do the oral challenge for - mixed tree nut butter. You must call to schedule an appointment and specify it's for a food challenge.  For mild symptoms you can take over the counter antihistamines such as Benadryl and monitor symptoms closely. If symptoms worsen or if you have severe symptoms including breathing issues, throat closure, significant swelling, whole body hives, severe diarrhea and vomiting, lightheadedness then inject epinephrine  and seek immediate medical care afterwards. Action plan updated.    Return in about 1 year (around 01/28/2023). Assessment and Plan              No follow-ups on file.  No  orders of the defined types were placed in this encounter.  Lab Orders  No laboratory test(s) ordered today    Diagnostics: Spirometry:  Tracings reviewed. Her effort: {Blank single:19197::"Good reproducible efforts.","It was hard to get consistent efforts and there is a question as to whether this reflects a maximal maneuver.","Poor effort, data can not be  interpreted."} FVC: ***L FEV1: ***L, ***% predicted FEV1/FVC ratio: ***% Interpretation: {Blank single:19197::"Spirometry consistent with mild obstructive disease","Spirometry consistent with moderate obstructive disease","Spirometry consistent with severe obstructive disease","Spirometry consistent with possible restrictive disease","Spirometry consistent with mixed obstructive and restrictive disease","Spirometry uninterpretable due to technique","Spirometry consistent with normal pattern","No overt abnormalities noted given today's efforts"}.  Please see scanned spirometry results for details.  Skin Testing: {Blank single:19197::"Select foods","Environmental allergy panel","Environmental allergy panel and select foods","Food allergy panel","None","Deferred due to recent antihistamines use"}. *** Results discussed with patient/family.   Medication List:  Current Outpatient Medications  Medication Sig Dispense Refill  . COLLAGEN PO Take by mouth.    . EPINEPHrine  0.3 mg/0.3 mL IJ SOAJ injection Inject 0.3 mg into the muscle as needed for anaphylaxis. 1 each 2  . VITAMIN D  PO Take by mouth.     No current facility-administered medications for this visit.   Allergies: Allergies  Allergen Reactions  . Shellfish Allergy Hives    +IgE testing, shrimp mainly  . Codeine Nausea Only   I reviewed her past medical history, social history, family history, and environmental history and no significant changes have been reported from her previous visit.  Review of Systems  Constitutional:  Negative for appetite change, chills, fever and unexpected weight change.  HENT:  Negative for congestion and rhinorrhea.   Eyes:  Negative for itching.  Respiratory:  Negative for cough, chest tightness, shortness of breath and wheezing.   Cardiovascular:  Negative for chest pain.  Gastrointestinal:  Negative for abdominal pain.  Genitourinary:  Negative for difficulty urinating.  Skin:  Negative for  rash.  Allergic/Immunologic: Positive for environmental allergies and food allergies.  Neurological:  Negative for headaches.   Objective: There were no vitals taken for this visit. There is no height or weight on file to calculate BMI. Physical Exam Vitals and nursing note reviewed.  Constitutional:      Appearance: Normal appearance. She is well-developed.  HENT:     Head: Normocephalic and atraumatic.     Right Ear: Tympanic membrane and external ear normal.     Left Ear: Tympanic membrane and external ear normal.     Nose: Nose normal.     Mouth/Throat:     Mouth: Mucous membranes are moist.     Pharynx: Oropharynx is clear.  Eyes:     Conjunctiva/sclera: Conjunctivae normal.  Cardiovascular:     Rate and Rhythm: Normal rate and regular rhythm.     Heart sounds: Normal heart sounds. No murmur heard.    No friction rub. No gallop.  Pulmonary:     Effort: Pulmonary effort is normal.     Breath sounds: Normal breath sounds. No wheezing, rhonchi or rales.  Musculoskeletal:     Cervical back: Neck supple.  Skin:    General: Skin is warm.     Findings: No rash.  Neurological:     Mental Status: She is alert and oriented to person, place, and time.  Psychiatric:        Behavior: Behavior normal.  Previous notes and tests were reviewed. The plan was reviewed with the patient/family, and all questions/concerned were addressed.  It was my pleasure  to see Yolanda Mccoy today and participate in her care. Please feel free to contact me with any questions or concerns.  Sincerely,  Eudelia Hero, DO Allergy & Immunology  [facility]  [place] office: [phone] [facility] office: [phone]

## 2023-10-05 ENCOUNTER — Ambulatory Visit: Admitting: Allergy

## 2023-10-05 ENCOUNTER — Encounter: Payer: Self-pay | Admitting: Allergy

## 2023-10-05 VITALS — BP 100/60 | HR 68 | Temp 98.3°F | Ht 60.83 in | Wt 114.8 lb

## 2023-10-05 DIAGNOSIS — J3089 Other allergic rhinitis: Secondary | ICD-10-CM | POA: Diagnosis not present

## 2023-10-05 DIAGNOSIS — R21 Rash and other nonspecific skin eruption: Secondary | ICD-10-CM

## 2023-10-05 DIAGNOSIS — T7800XD Anaphylactic reaction due to unspecified food, subsequent encounter: Secondary | ICD-10-CM

## 2023-10-05 MED ORDER — EPINEPHRINE 0.3 MG/0.3ML IJ SOAJ
0.3000 mg | INTRAMUSCULAR | 1 refills | Status: AC | PRN
Start: 2023-10-05 — End: ?

## 2023-10-05 NOTE — Patient Instructions (Addendum)
 Rash  Keep track of rashes and take pictures. Write down what you had done/eaten during flares.  See below for proper skin care. Use fragrance free and dye free products. No dryer sheets or fabric softener.   Use over the counter antihistamines such as Zyrtec (cetirizine), Claritin (loratadine), Allegra  (fexofenadine ), or Xyzal  (levocetirizine) daily as needed. May switch antihistamines every few months. Recommend NOT taking Benadryl.   Food I think the bloodwork may have been overly sensitive. Monitor symptoms after you eat wheat, corn, soy, tomato, orange, apple, red meat.  If you notice issues stop and let us  know.  Continue to avoid shellfish, peanuts, tree nuts, eggs.  I have prescribed epinephrine  injectable device and demonstrated proper use. For mild symptoms you can take over the counter antihistamines (zyrtec 10mg  to 20mg ) and monitor symptoms closely.  If symptoms worsen or if you have severe symptoms including breathing issues, throat closure, significant swelling, whole body hives, severe diarrhea and vomiting, lightheadedness then use epinephrine  and seek immediate medical care afterwards. Emergency action plan given.  Environmental allergies 2025 labs positive to grass, tree, weed and ragweed pollen.  Borderline positive to dust mites, cockroach, mold.  Start environmental control measures as below. Consider allergy injections for long term control if above medications do not help the symptoms - handout given.   Return in about 2 months (around 12/05/2023). Or sooner if needed.   Skin care recommendations  Bath time: Always use lukewarm water. AVOID very hot or cold water. Keep bathing time to 5-10 minutes. Do NOT use bubble bath. Use a mild soap and use just enough to wash the dirty areas. Do NOT scrub skin vigorously.  After bathing, pat dry your skin with a towel. Do NOT rub or scrub the skin.  Moisturizers and prescriptions:  ALWAYS apply moisturizers  immediately after bathing (within 3 minutes). This helps to lock-in moisture. Use the moisturizer several times a day over the whole body. Good summer moisturizers include: Aveeno, CeraVe, Cetaphil. Good winter moisturizers include: Aquaphor, Vaseline, Cerave, Cetaphil, Eucerin, Vanicream. When using moisturizers along with medications, the moisturizer should be applied about one hour after applying the medication to prevent diluting effect of the medication or moisturize around where you applied the medications. When not using medications, the moisturizer can be continued twice daily as maintenance.  Laundry and clothing: Avoid laundry products with added color or perfumes. Use unscented hypo-allergenic laundry products such as Tide free, Cheer free & gentle, and All free and clear.  If the skin still seems dry or sensitive, you can try double-rinsing the clothes. Avoid tight or scratchy clothing such as wool. Do not use fabric softeners or dyer sheets.   Reducing Pollen Exposure Pollen seasons: trees (spring), grass (summer) and ragweed/weeds (fall). Keep windows closed in your home and car to lower pollen exposure.  Install air conditioning in the bedroom and throughout the house if possible.  Avoid going out in dry windy days - especially early morning. Pollen counts are highest between 5 - 10 AM and on dry, hot and windy days.  Save outside activities for late afternoon or after a heavy rain, when pollen levels are lower.  Avoid mowing of grass if you have grass pollen allergy. Be aware that pollen can also be transported indoors on people and pets.  Dry your clothes in an automatic dryer rather than hanging them outside where they might collect pollen.  Rinse hair and eyes before bedtime.  Control of House Dust Mite Allergen Dust mite allergens  are a common trigger of allergy and asthma symptoms. While they can be found throughout the house, these microscopic creatures thrive in  warm, humid environments such as bedding, upholstered furniture and carpeting. Because so much time is spent in the bedroom, it is essential to reduce mite levels there.  Encase pillows, mattresses, and box springs in special allergen-proof fabric covers or airtight, zippered plastic covers.  Bedding should be washed weekly in hot water (130 F) and dried in a hot dryer. Allergen-proof covers are available for comforters and pillows that can't be regularly washed.  Wash the allergy-proof covers every few months. Minimize clutter in the bedroom. Keep pets out of the bedroom.  Keep humidity less than 50% by using a dehumidifier or air conditioning. You can buy a humidity measuring device called a hygrometer to monitor this.  If possible, replace carpets with hardwood, linoleum, or washable area rugs. If that's not possible, vacuum frequently with a vacuum that has a HEPA filter. Remove all upholstered furniture and non-washable window drapes from the bedroom. Remove all non-washable stuffed toys from the bedroom.  Wash stuffed toys weekly.  Cockroach Allergen Avoidance Cockroaches are often found in the homes of densely populated urban areas, schools or commercial buildings, but these creatures can lurk almost anywhere. This does not mean that you have a dirty house or living area. Block all areas where roaches can enter the home. This includes crevices, wall cracks and windows.  Cockroaches need water to survive, so fix and seal all leaky faucets and pipes. Have an exterminator go through the house when your family and pets are gone to eliminate any remaining roaches. Keep food in lidded containers and put pet food dishes away after your pets are done eating. Vacuum and sweep the floor after meals, and take out garbage and recyclables. Use lidded garbage containers in the kitchen. Wash dishes immediately after use and clean under stoves, refrigerators or toasters where crumbs can accumulate. Wipe off  the stove and other kitchen surfaces and cupboards regularly.  Mold Control Mold and fungi can grow on a variety of surfaces provided certain temperature and moisture conditions exist.  Outdoor molds grow on plants, decaying vegetation and soil. The major outdoor mold, Alternaria and Cladosporium, are found in very high numbers during hot and dry conditions. Generally, a late summer - fall peak is seen for common outdoor fungal spores. Rain will temporarily lower outdoor mold spore count, but counts rise rapidly when the rainy period ends. The most important indoor molds are Aspergillus and Penicillium. Dark, humid and poorly ventilated basements are ideal sites for mold growth. The next most common sites of mold growth are the bathroom and the kitchen. Outdoor (Seasonal) Mold Control Use air conditioning and keep windows closed. Avoid exposure to decaying vegetation. Avoid leaf raking. Avoid grain handling. Consider wearing a face mask if working in moldy areas.  Indoor (Perennial) Mold Control  Maintain humidity below 50%. Get rid of mold growth on hard surfaces with water, detergent and, if necessary, 5% bleach (do not mix with other cleaners). Then dry the area completely. If mold covers an area more than 10 square feet, consider hiring an indoor environmental professional. For clothing, washing with soap and water is best. If moldy items cannot be cleaned and dried, throw them away. Remove sources e.g. contaminated carpets. Repair and seal leaking roofs or pipes. Using dehumidifiers in damp basements may be helpful, but empty the water and clean units regularly to prevent mildew from forming. All  rooms, especially basements, bathrooms and kitchens, require ventilation and cleaning to deter mold and mildew growth. Avoid carpeting on concrete or damp floors, and storing items in damp areas.

## 2023-10-12 ENCOUNTER — Telehealth: Payer: Self-pay | Admitting: Allergy

## 2023-10-12 NOTE — Telephone Encounter (Signed)
 [facility] called for an last office note on pt to be faxed to [phone]

## 2023-10-22 ENCOUNTER — Telehealth: Payer: Self-pay | Admitting: Allergy

## 2023-10-22 NOTE — Telephone Encounter (Signed)
 Husband states the Epi-Pen that was sent in on 6/10, [facility] claims they never received the prescription.  Husband would like the Epi-Pen sent to [facility].

## 2023-10-25 NOTE — Telephone Encounter (Signed)
 Called and spoke to pharmacist and he expressed that they did get the Epipen  on June 10 but they put medications pack after not being picked up after two weeks. Pharmacist stated that the Epipen  was picked up yesterday.  Called and left a message for patient to call our office back to confirm that they have the Epipen .  I did speak to patients husband who expressed that  she did in fact get the Epipen  yesterday.

## 2023-12-14 ENCOUNTER — Ambulatory Visit: Admitting: Allergy
